# Patient Record
Sex: Female | Born: 1965 | Race: White | Hispanic: No | Marital: Married | Smoking: Former smoker
Health system: Southern US, Community
[De-identification: ages and names within clinical notes are randomized; demographics above are authoritative.]

## PROBLEM LIST (undated history)

## (undated) DIAGNOSIS — F429 Obsessive-compulsive disorder, unspecified: Secondary | ICD-10-CM

## (undated) DIAGNOSIS — E063 Autoimmune thyroiditis: Secondary | ICD-10-CM

## (undated) DIAGNOSIS — D582 Other hemoglobinopathies: Secondary | ICD-10-CM

## (undated) DIAGNOSIS — E785 Hyperlipidemia, unspecified: Secondary | ICD-10-CM

## (undated) HISTORY — PX: MINOR CARPAL TUNNEL: SHX6472

## (undated) HISTORY — DX: Autoimmune thyroiditis: E06.3

## (undated) HISTORY — DX: Other hemoglobinopathies: D58.2

## (undated) HISTORY — DX: Hyperlipidemia, unspecified: E78.5

---

## 1999-08-18 ENCOUNTER — Other Ambulatory Visit: Admission: RE | Admit: 1999-08-18 | Discharge: 1999-08-18 | Payer: Self-pay | Admitting: *Deleted

## 1999-09-28 ENCOUNTER — Other Ambulatory Visit: Admission: RE | Admit: 1999-09-28 | Discharge: 1999-09-28 | Payer: Self-pay | Admitting: *Deleted

## 1999-09-28 ENCOUNTER — Encounter (INDEPENDENT_AMBULATORY_CARE_PROVIDER_SITE_OTHER): Payer: Self-pay | Admitting: Specialist

## 2000-01-18 ENCOUNTER — Other Ambulatory Visit: Admission: RE | Admit: 2000-01-18 | Discharge: 2000-01-18 | Payer: Self-pay | Admitting: *Deleted

## 2000-05-29 ENCOUNTER — Other Ambulatory Visit: Admission: RE | Admit: 2000-05-29 | Discharge: 2000-05-29 | Payer: Self-pay | Admitting: Gynecology

## 2000-10-06 ENCOUNTER — Ambulatory Visit (HOSPITAL_COMMUNITY): Admission: RE | Admit: 2000-10-06 | Discharge: 2000-10-06 | Payer: Self-pay | Admitting: Obstetrics and Gynecology

## 2000-11-07 ENCOUNTER — Encounter: Payer: Self-pay | Admitting: Obstetrics and Gynecology

## 2000-11-07 ENCOUNTER — Observation Stay (HOSPITAL_COMMUNITY): Admission: AD | Admit: 2000-11-07 | Discharge: 2000-11-08 | Payer: Self-pay | Admitting: Obstetrics & Gynecology

## 2001-01-12 ENCOUNTER — Inpatient Hospital Stay (HOSPITAL_COMMUNITY): Admission: RE | Admit: 2001-01-12 | Discharge: 2001-01-16 | Payer: Self-pay | Admitting: Obstetrics and Gynecology

## 2001-01-12 ENCOUNTER — Encounter: Payer: Self-pay | Admitting: Obstetrics and Gynecology

## 2001-01-16 ENCOUNTER — Inpatient Hospital Stay (HOSPITAL_COMMUNITY): Admission: AD | Admit: 2001-01-16 | Discharge: 2001-01-16 | Payer: Self-pay | Admitting: Obstetrics & Gynecology

## 2001-06-11 ENCOUNTER — Other Ambulatory Visit: Admission: RE | Admit: 2001-06-11 | Discharge: 2001-06-11 | Payer: Self-pay | Admitting: *Deleted

## 2002-09-05 ENCOUNTER — Other Ambulatory Visit: Admission: RE | Admit: 2002-09-05 | Discharge: 2002-09-05 | Payer: Self-pay | Admitting: *Deleted

## 2002-10-11 ENCOUNTER — Encounter: Admission: RE | Admit: 2002-10-11 | Discharge: 2002-10-11 | Payer: Self-pay | Admitting: Endocrinology

## 2002-10-11 ENCOUNTER — Encounter: Payer: Self-pay | Admitting: Endocrinology

## 2003-05-16 ENCOUNTER — Encounter: Admission: RE | Admit: 2003-05-16 | Discharge: 2003-05-16 | Payer: Self-pay | Admitting: Family Medicine

## 2003-09-10 ENCOUNTER — Other Ambulatory Visit: Admission: RE | Admit: 2003-09-10 | Discharge: 2003-09-10 | Payer: Self-pay | Admitting: *Deleted

## 2004-09-28 ENCOUNTER — Other Ambulatory Visit: Admission: RE | Admit: 2004-09-28 | Discharge: 2004-09-28 | Payer: Self-pay | Admitting: *Deleted

## 2006-01-03 ENCOUNTER — Other Ambulatory Visit: Admission: RE | Admit: 2006-01-03 | Discharge: 2006-01-03 | Payer: Self-pay | Admitting: *Deleted

## 2007-01-18 ENCOUNTER — Other Ambulatory Visit: Admission: RE | Admit: 2007-01-18 | Discharge: 2007-01-18 | Payer: Self-pay | Admitting: *Deleted

## 2010-09-23 ENCOUNTER — Other Ambulatory Visit: Payer: Self-pay | Admitting: Orthopedic Surgery

## 2010-09-23 DIAGNOSIS — M25561 Pain in right knee: Secondary | ICD-10-CM

## 2010-09-23 DIAGNOSIS — R609 Edema, unspecified: Secondary | ICD-10-CM

## 2010-09-25 ENCOUNTER — Ambulatory Visit
Admission: RE | Admit: 2010-09-25 | Discharge: 2010-09-25 | Disposition: A | Discharge status: Home / Self Care | Payer: Self-pay | Source: Ambulatory Visit | Attending: Orthopedic Surgery | Admitting: Orthopedic Surgery

## 2010-09-25 DIAGNOSIS — R609 Edema, unspecified: Secondary | ICD-10-CM

## 2010-09-25 DIAGNOSIS — M25561 Pain in right knee: Secondary | ICD-10-CM

## 2010-11-12 NOTE — Discharge Summary (Signed)
[facility]  Patient:    Teresa Sanders, Teresa Sanders Visit Number: [id number] MRN: [id number]          Service Type: MED Location: [facility] Attending Physician:  Lars Pinks Dictated by:   Leilani Able, P.A. Admit Date:  01/16/2001 Discharge Date: 01/16/2001                             Discharge Summary  FINAL DIAGNOSES:              1. Intrauterine pregnancy at [redacted] weeks                                  gestation.                               2. Oligohydramnios.                               3. Arrest of dilation at 8 cm.                               4. Chorioamnionitis.  PROCEDURE:                    Primary low transverse cesarean section.  SURGEON:                      Marcelle Overlie, M.D.  COMPLICATIONS:                None.  HOSPITAL COURSE:              This 46 year old G1, P0 presents at [redacted] weeks gestation for induction secondary to oligohydramnios.  The patient was started on Pitocin protocol.  She progressed very slowly throughout the day and started to develop a temperature later on in the day.  Because her cervix was only 8 cm dilated and she was developing chorioamnionitis, she was to proceed with a cesarean section.  She was taken to the operating room by Dr. Vincente Poli on January 13, 2001, where a primary low transverse cesarean section was performed with the delivery of an 8 pound 5 ounce female infant with Apgars of 8 and 9. The baby was in a transverse position. The delivery went without complications.  The patients postoperative course was complicated by some low grade temperatures which resolved during her hospital stay.  She was afebrile upon discharge.  She was felt ready for discharge on postoperative day #3.  DISCHARGE DIET:               She was sent home on a regular diet.  ACTIVITY:                     She was told to decrease activity.  DISCHARGE MEDICATIONS:        She was told to continue prenatal vitamin.  She was told  she could use over-the-counter pain medications.  FOLLOW-UP:                    She was to follow up in the office in four weeks. Dictated by:   Leilani Able, P.A. Attending Physician:  Lars Pinks  DD:  03/05/01 TD:  03/05/01 Job: [id number]

## 2010-11-12 NOTE — Op Note (Signed)
[facility]  Patient:    SAGAN, WURZEL                             MRN: [id number] Proc. Date: 01/13/01 Adm. Date:  [id number] Attending:  Conley Simmonds A                           Operative Report  PREOPERATIVE DIAGNOSES:         1. Intrauterine pregnancy at 41 weeks.                                 2. Oligohydramnios.                                 3. Arrest of dilatation at 8 cm.                                 4. Chorioamnionitis.  POSTOPERATIVE DIAGNOSES:        1. Intrauterine pregnancy at 41 weeks.                                 2. Oligohydramnios.                                 3. Arrest of dilatation at 8 cm.                                 4. Chorioamnionitis.  OPERATION:                      Primary low transverse cesarean section.  SURGEON:                        Marcelle Overlie, M.D.  ANESTHESIA:                     Epidural.  ESTIMATED BLOOD LOSS:           500 cc.  FINDINGS:                       Female infant in transverse position with Apgars of 8 at one minute and 9 at five minutes, weighing 8 pounds, 5 ounces. COMPLICATIONS:                  None.  DESCRIPTION OF PROCEDURE:       The patient was taken to the operating room where epidural was dosed and found to be adequate.  The abdomen was prepped and draped in the usual sterile fashion.  The Foley catheter was already placed in the bladder.  The scalpel was used to perform a low transverse incision in the skin and it was carried down to the fascia using electrocautery.  The fascia was scored in the midline and extended laterally. A Pfannenstiel incision was then created and the rectus muscles were separated.  The peritoneum was then entered and the peritoneal incision was then stretched.  The bladder  blade was inserted.  The lower uterine segment was identified and the bladder flap was created sharply and then digitally and then the bladder blade was then readjusted.  A low  transverse incision was made in the uterus and was then extended laterally.  The amniotic fluid was noted to be clear upon entry into the uterine cavity.  The baby was delivered in cephalic presentation.  It was a female infant with Apgars of 8 at one minute and 9 at five minutes, weighing 8 pounds 5 ounces.  The cord was clamped and cut.  Cord blood was obtained, and the placenta was manually removed and noted to be intact.  The uterus was cleared of all clots and debris.  The uterine incision was closed in a single layer using 0 chromic in a continuous running locked stitch.  The wound was inspected and was found to be hemostatic.  The peritoneum was closed using 0 Vicryl in a continuous running stitch, and the rectus muscles were reapproximated using 7-0 Vicryl.  The fascia was closed using 0 Vicryl in a continuous running stitch starting at each corner and meeting in the midline.  After inspection of the subcutaneous layer, the skin was closed with staples.  All sponge, lap and instrument counts were correct x 2.  The patient tolerated the procedure well and went to the recovery room in stable condition. DD:  01/13/01 TD:  01/15/01 Job: [id number]

## 2011-09-22 ENCOUNTER — Emergency Department (HOSPITAL_BASED_OUTPATIENT_CLINIC_OR_DEPARTMENT_OTHER)
Admission: EM | Admit: 2011-09-22 | Discharge: 2011-09-22 | Disposition: A | Discharge status: Home / Self Care | Payer: 59 | Attending: Emergency Medicine | Admitting: Emergency Medicine

## 2011-09-22 ENCOUNTER — Emergency Department (INDEPENDENT_AMBULATORY_CARE_PROVIDER_SITE_OTHER): Payer: 59

## 2011-09-22 ENCOUNTER — Encounter (HOSPITAL_BASED_OUTPATIENT_CLINIC_OR_DEPARTMENT_OTHER): Payer: Self-pay | Admitting: *Deleted

## 2011-09-22 DIAGNOSIS — S93409A Sprain of unspecified ligament of unspecified ankle, initial encounter: Secondary | ICD-10-CM | POA: Insufficient documentation

## 2011-09-22 DIAGNOSIS — M25579 Pain in unspecified ankle and joints of unspecified foot: Secondary | ICD-10-CM

## 2011-09-22 DIAGNOSIS — R609 Edema, unspecified: Secondary | ICD-10-CM | POA: Insufficient documentation

## 2011-09-22 DIAGNOSIS — E079 Disorder of thyroid, unspecified: Secondary | ICD-10-CM | POA: Insufficient documentation

## 2011-09-22 DIAGNOSIS — X500XXA Overexertion from strenuous movement or load, initial encounter: Secondary | ICD-10-CM | POA: Insufficient documentation

## 2011-09-22 HISTORY — DX: Obsessive-compulsive disorder, unspecified: F42.9

## 2011-09-22 MED ORDER — OXYCODONE-ACETAMINOPHEN 5-325 MG PO TABS: 2.0000 | ORAL_TABLET | ORAL | Status: AC | PRN

## 2011-09-22 NOTE — ED Notes (Signed)
Twisted her right ankle 3 days ago. Swelling and pain. Has been walking on it since the injury.

## 2011-09-22 NOTE — Discharge Instructions (Signed)
Ankle Sprain An ankle sprain is an injury to the strong, fibrous tissues (ligaments) that hold the bones of your ankle joint together.  CAUSES Ankle sprain usually is caused by a fall or by twisting your ankle. People who participate in sports are more prone to these types of injuries.  SYMPTOMS  Symptoms of ankle sprain include:  Pain in your ankle. The pain may be present at rest or only when you are trying to stand or walk.   Swelling.   Bruising. Bruising may develop immediately or within 1 to 2 days after your injury.   Difficulty standing or walking.  DIAGNOSIS  Your caregiver will ask you details about your injury and perform a physical exam of your ankle to determine if you have an ankle sprain. During the physical exam, your caregiver will press and squeeze specific areas of your foot and ankle. Your caregiver will try to move your ankle in certain ways. An X-ray exam may be done to be sure a bone was not broken or a ligament did not separate from one of the bones in your ankle (avulsion).  TREATMENT  Certain types of braces can help stabilize your ankle. Your caregiver can make a recommendation for this. Your caregiver may recommend the use of medication for pain. If your sprain is severe, your caregiver may refer you to a surgeon who helps to restore function to parts of your skeletal system (orthopedist) or a physical therapist. HOME CARE INSTRUCTIONS  Apply ice to your injury for 1 to 2 days or as directed by your caregiver. Applying ice helps to reduce inflammation and pain.  Put ice in a plastic bag.   Place a towel between your skin and the bag.   Leave the ice on for 15 to 20 minutes at a time, every 2 hours while you are awake.   Take over-the-counter or prescription medicines for pain, discomfort, or fever only as directed by your caregiver.   Keep your injured leg elevated, when possible, to lessen swelling.   If your caregiver recommends crutches, use them as  instructed. Gradually, put weight on the affected ankle. Continue to use crutches or a cane until you can walk without feeling pain in your ankle.   If you have a plaster splint, wear the splint as directed by your caregiver. Do not rest it on anything harder than a pillow the first 24 hours. Do not put weight on it. Do not get it wet. You may take it off to take a shower or bath.   You may have been given an elastic bandage to wear around your ankle to provide support. If the elastic bandage is too tight (you have numbness or tingling in your foot or your foot becomes cold and blue), adjust the bandage to make it comfortable.   If you have an air splint, you may blow more air into it or let air out to make it more comfortable. You may take your splint off at night and before taking a shower or bath.   Wiggle your toes in the splint several times per day if you are able.  SEEK MEDICAL CARE IF:   You have an increase in bruising, swelling, or pain.   Your toes feel cold.   Pain relief is not achieved with medication.  SEEK IMMEDIATE MEDICAL CARE IF: Your toes are numb or blue or you have severe pain. MAKE SURE YOU:   Understand these instructions.   Will watch your condition.     Will get help right away if you are not doing well or get worse.  Document Released: 06/13/2005 Document Revised: 06/02/2011 Document Reviewed: 01/16/2008 Exit[employer] Patient Information 2012 [employer]. 

## 2011-09-22 NOTE — ED Provider Notes (Signed)
History     CSN: [id number]  Arrival date & time 09/22/11  1939   First MD Initiated Contact with Patient 09/22/11 2000      Chief Complaint  Patient presents with  . Ankle Pain    (Consider location/radiation/quality/duration/timing/severity/associated sxs/prior treatment) Patient is a 46 y.o. female presenting with ankle pain. The history is provided by the patient.  Ankle Pain  Incident onset: 3 days ago. The incident occurred in the street. Injury mechanism: Twisted her ankle while stepping off a curb. The pain is present in the right ankle. The quality of the pain is described as aching and throbbing. The pain is moderate. The pain has been constant since onset. Pertinent negatives include no numbness. The symptoms are aggravated by bearing weight. She has tried NSAIDs for the symptoms. The treatment provided no relief.    Past Medical History  Diagnosis Date  . Thyroid disease   . OCD (obsessive compulsive disorder)     Past Surgical History  Procedure Date  . Cesarean section     No family history on file.  History  Substance Use Topics  . Smoking status: Current Everyday Smoker -- 0.5 packs/day  . Smokeless tobacco: Not on file  . Alcohol Use: No    OB History    Grav Para Term Preterm Abortions TAB SAB Ect Mult Living                  Review of Systems  Constitutional: Negative for fever.  HENT: Negative for neck pain.   Eyes: Negative.   Gastrointestinal: Negative for nausea and vomiting.  Musculoskeletal: Positive for joint swelling. Negative for back pain.  Skin: Negative for wound.  Neurological: Negative for weakness, numbness and headaches.    Allergies  Penicillins  Home Medications   Current Outpatient Rx  Name Route Sig Dispense Refill  . ALPRAZOLAM 0.25 MG PO TABS Oral Take 0.25 mg by mouth at bedtime as needed.    Marland Kitchen ESCITALOPRAM OXALATE 20 MG PO TABS Oral Take 20 mg by mouth at bedtime.    . IBUPROFEN 200 MG PO TABS Oral Take 200 mg  by mouth every 6 (six) hours as needed. For pain.    Marland Kitchen LEVOTHYROXINE SODIUM 200 MCG PO TABS Oral Take 200 mcg by mouth daily.    . OXYCODONE-ACETAMINOPHEN 5-325 MG PO TABS Oral Take 2 tablets by mouth every 4 (four) hours as needed for pain. 15 tablet 0    BP 120/73  Pulse 80  Temp 98.3 F (36.8 C)  Resp 20  SpO2 99%  LMP 08/22/2011  Physical Exam  Constitutional: She is oriented to person, place, and time. She appears well-developed and well-nourished.  HENT:  Head: Normocephalic and atraumatic.  Musculoskeletal: She exhibits edema and tenderness.       Positive swelling and pain over the lateral malleolus of the right ankle. There is no pain to the foot. There is some tenderness to the proximal fibula. She has normal sensation in her foot. Normal motor function in her foot. Pulses are intact in the foot. No wounds are noted  Neurological: She is alert and oriented to person, place, and time.    ED Course  Procedures (including critical care time)  No results found for this or any previous visit. Dg Tibia/fibula Right  09/22/2011  *RADIOLOGY REPORT*  Clinical Data: Pain, injured  RIGHT TIBIA AND FIBULA - 2 VIEW  Comparison:  None.  Findings: There is no evidence of fracture or other focal  bone lesions.  Soft tissues are unremarkable.  IMPRESSION: Negative.  Original Report Authenticated By: Elsie Stain, M.D.   Dg Ankle Complete Right  09/22/2011  *RADIOLOGY REPORT*  Clinical Data: Twisted 3 days ago, pain  RIGHT ANKLE - COMPLETE 3+ VIEW  Comparison: None.  Findings: Moderate soft tissue swelling.  Small rounded density inferior to the medial malleolus likely represents an old injury.  More sharply angled density inferior to the lateral malleolus, seen best on the oblique view, could represent a small acute avulsion. The talus appears intact as does the ankle mortise.  There is no transverse malleolar fracture.  IMPRESSION: Question small avulsion fracture inferior aspect lateral  malleolus (arrow) with moderate diffuse soft tissue swelling.  Original Report Authenticated By: Elsie Stain, M.D.     1. Ankle sprain       MDM  Patient placed in air splint, given crutches. Instructed in ice and elevation. We'll give prescription for pain medicine and refer to ortho if symptoms not improving        Rolan Bucco, MD 09/22/11 2039

## 2013-05-27 HISTORY — PX: OTHER SURGICAL HISTORY: SHX169

## 2014-10-10 ENCOUNTER — Other Ambulatory Visit: Payer: Self-pay | Admitting: Gastroenterology

## 2014-10-10 DIAGNOSIS — R109 Unspecified abdominal pain: Secondary | ICD-10-CM

## 2014-10-16 ENCOUNTER — Ambulatory Visit
Admission: RE | Admit: 2014-10-16 | Discharge: 2014-10-16 | Disposition: A | Discharge status: Home / Self Care | Payer: 59 | Source: Ambulatory Visit | Attending: Gastroenterology | Admitting: Gastroenterology

## 2014-10-16 DIAGNOSIS — R109 Unspecified abdominal pain: Secondary | ICD-10-CM

## 2014-11-13 ENCOUNTER — Other Ambulatory Visit: Payer: Self-pay | Admitting: General Surgery

## 2014-11-13 ENCOUNTER — Encounter (HOSPITAL_COMMUNITY): Payer: Self-pay | Admitting: *Deleted

## 2014-11-13 NOTE — H&P (Signed)
History of Present Illness Teresa Sanders(Teresa Remund T. Gearlene Godsil MD; 10/30/2014 12:47 PM) Patient words: gallstones.  The patient is a 49 year old female who presents for evaluation of gallbladder disease. She is referred by Dr. Kinnie ScalesMedoff for recurrent abdominal pain and cholelithiasis. She has had some symptoms for a number of months and possibly longer in retrospect. Several months ago after eating at [employer] she developed significant epigastric pressure and pain and nausea. There was some radiation through to the back. Since then she has had ongoing issues. She will occasionally get high back pain at random times. After eating a particularly with fatty foods she continues to have epigastric discomfort and pressure and occasionally nausea. She has a history of IBS and some history of reflux and hiatal hernia and has seen Dr. Kinnie ScalesMedoff for some time. She presented to him for evaluation. Abdominal ultrasound was obtained and she was referred Ultrasound shows multiple gallstones, no significant gallbladder wall thickening. Common bile duct 5.5 mm. Liver shows mild fatty infiltration.   Other Problems Teresa Sanders(Teresa [place]; 10/30/2014 12:44 PM) Cholelithiasis Gastroesophageal Reflux Disease Thyroid Disease  Past Surgical History Teresa Sanders(Teresa Sanders, Sanders; 10/30/2014 12:44 PM) Cesarean Section - 1 Oral Surgery  Diagnostic Studies History Teresa Sanders(Teresa [place]; 10/30/2014 12:44 PM) Colonoscopy 1-5 years ago Mammogram within last year Pap Smear 1-5 years ago  Allergies Teresa Sanders(Teresa Sanders, Sanders; 10/30/2014 12:37 PM) Penicillin G Pot in Dextrose *PENICILLINS*  Medication History Teresa Sanders(Teresa Sanders, Sanders; 10/30/2014 12:36 PM) ALPRAZolam (0.5MG  Tablet, Oral) Active. Escitalopram Oxalate (20MG  Tablet, Oral) Active. Synthroid (200MCG Tablet, Oral) Active. Ibuprofen (200MG  Tablet, Oral) Active. Medications Reconciled  Social History Teresa Sanders(Teresa [place]; 10/30/2014 12:44 PM) Alcohol use Occasional  alcohol use. Caffeine use Carbonated beverages. No drug use Tobacco use Current every day smoker.  Family History Teresa Sanders(Teresa [place]; 10/30/2014 12:44 PM) Alcohol Abuse Father, Sister. Anesthetic complications Mother. Cancer Mother. Colon Cancer Mother.  Pregnancy / Birth History Teresa Sanders(Teresa Sanders[place]; 10/30/2014 12:44 PM) Age at menarche 10 years. Age of menopause 746-50 Contraceptive History Oral contraceptives. Gravida 2 Maternal age 48-30 Para 1 Regular periods  Review of Systems Teresa Sanders(Teresa Sanders; 10/30/2014 12:44 PM) General Not Present- Appetite Loss, Chills, Fatigue, Fever, Night Sweats, Weight Gain and Weight Loss. Skin Not Present- Change in Wart/Mole, Dryness, Hives, Jaundice, New Lesions, Non-Healing Wounds, Rash and Ulcer. HEENT Present- Wears glasses/contact lenses. Not Present- Earache, Hearing Loss, Hoarseness, Nose Bleed, Oral Ulcers, Ringing in the Ears, Seasonal Allergies, Sinus Pain, Sore Throat, Visual Disturbances and Yellow Eyes. Respiratory Not Present- Bloody sputum, Chronic Cough, Difficulty Breathing, Snoring and Wheezing. Breast Not Present- Breast Mass, Breast Pain, Nipple Discharge and Skin Changes. Cardiovascular Not Present- Chest Pain, Difficulty Breathing Lying Down, Leg Cramps, Palpitations, Rapid Heart Rate, Shortness of Breath and Swelling of Extremities. Gastrointestinal Present- Nausea. Not Present- Abdominal Pain, Bloating, Bloody Stool, Change in Bowel Habits, Chronic diarrhea, Constipation, Difficulty Swallowing, Excessive gas, Gets full quickly at meals, Hemorrhoids, Indigestion, Rectal Pain and Vomiting. Female Genitourinary Not Present- Frequency, Nocturia, Painful Urination, Pelvic Pain and Urgency. Musculoskeletal Not Present- Back Pain, Joint Pain, Joint Stiffness, Muscle Pain, Muscle Weakness and Swelling of Extremities. Neurological Not Present- Decreased Memory, Fainting, Headaches, Numbness, Seizures, Tingling,  Tremor, Trouble walking and Weakness. Psychiatric Present- Anxiety. Not Present- Bipolar, Change in Sleep Pattern, Depression, Fearful and Frequent crying. Endocrine Not Present- Cold Intolerance, Excessive Hunger, Hair Changes, Heat Intolerance, Hot flashes and New Diabetes. Hematology Not Present- Easy Bruising, Excessive bleeding, Gland problems, HIV and  Persistent Infections.   Vitals [employer](Teresa Sanders; 10/30/2014 12:35 PM) 10/30/2014 12:34 PM Weight: 193.38 lb Height: 63in Body Surface Area: 1.97 m Body Mass Index: 34.25 kg/m BP: 116/72 (Sitting, Left Arm, Standard)    Physical Exam Teresa Sanders(Teresa Lehrman T. Aqib Lough MD; 10/30/2014 12:51 PM) The physical exam findings are as follows: Note:General: Alert, mildly obese Caucasian female, in no distress Skin: Warm and dry without rash or infection. HEENT: No palpable masses or thyromegaly. Sclera nonicteric. Pupils equal round and reactive. Oropharynx clear. Lymph nodes: No cervical, supraclavicular, or inguinal nodes palpable. Lungs: Breath sounds clear and equal. No wheezing or increased work of breathing. Cardiovascular: Regular rate and rhythm without murmer. No JVD or edema. Peripheral pulses intact. No carotid bruits. Abdomen: Nondistended. Soft and nontender. No masses palpable. No organomegaly. No palpable hernias. Extremities: No edema or joint swelling or deformity. No chronic venous stasis changes. Neurologic: Alert and fully oriented. Gait normal. No focal weakness. Psychiatric: Normal mood and affect. Thought content appropriate with normal judgement and insight    Assessment & Plan Teresa Sanders(Teresa Wollam T. Froylan Hobby MD; 10/30/2014 1:04 PM) CALCULUS OF GALLBLADDER WITH CHRONIC CHOLECYSTITIS WITHOUT OBSTRUCTION (574.10  K80.10) Impression: Her symptoms are entirely compatible with biliary colic and chronic cholecystitis. Gallbladder ultrasound confirms multiple gallstones with no apparent complicating factors. I've recommended proceeding  with laparoscopic cholecystectomy with cholangiogram in an effort to relieve her current symptoms and prevent complications from her gallstones. I discussed the procedure in detail. The patient was given [employer] engineered[employer]ational material. We discussed the risks and benefits of a laparoscopic cholecystectomy and possible cholangiogram including, but not limited to, bleeding, infection, injury to surrounding structures such as the intestine or liver, bile leak, retained gallstones, need to convert to an open procedure, prolonged diarrhea, blood clots such as DVT, common bile duct injury, anesthesia risks, and possible need for additional procedures. The likelihood of improvement in symptoms and return to the patient's normal status is good. We discussed the typical post-operative recovery course. All questions were answered. Current Plans  Schedule for Surgery Laparoscopic cholecystectomy with intraoperative cholangiogram under general anesthesia as an outpatient

## 2014-11-14 ENCOUNTER — Ambulatory Visit (HOSPITAL_COMMUNITY): Payer: 59 | Admitting: Anesthesiology

## 2014-11-14 ENCOUNTER — Ambulatory Visit (HOSPITAL_COMMUNITY): Payer: 59

## 2014-11-14 ENCOUNTER — Ambulatory Visit (HOSPITAL_COMMUNITY)
Admission: RE | Admit: 2014-11-14 | Discharge: 2014-11-14 | Disposition: A | Discharge status: Home / Self Care | Payer: 59 | Source: Ambulatory Visit | Attending: General Surgery | Admitting: General Surgery

## 2014-11-14 ENCOUNTER — Encounter (HOSPITAL_COMMUNITY)
Admission: RE | Disposition: A | Discharge status: Home / Self Care | Payer: Self-pay | Source: Ambulatory Visit | Attending: General Surgery

## 2014-11-14 ENCOUNTER — Encounter (HOSPITAL_COMMUNITY): Payer: Self-pay | Admitting: *Deleted

## 2014-11-14 DIAGNOSIS — K589 Irritable bowel syndrome without diarrhea: Secondary | ICD-10-CM | POA: Diagnosis not present

## 2014-11-14 DIAGNOSIS — F17200 Nicotine dependence, unspecified, uncomplicated: Secondary | ICD-10-CM | POA: Diagnosis not present

## 2014-11-14 DIAGNOSIS — Z791 Long term (current) use of non-steroidal anti-inflammatories (NSAID): Secondary | ICD-10-CM | POA: Diagnosis not present

## 2014-11-14 DIAGNOSIS — Z79899 Other long term (current) drug therapy: Secondary | ICD-10-CM | POA: Diagnosis not present

## 2014-11-14 DIAGNOSIS — K660 Peritoneal adhesions (postprocedural) (postinfection): Secondary | ICD-10-CM | POA: Insufficient documentation

## 2014-11-14 DIAGNOSIS — K8010 Calculus of gallbladder with chronic cholecystitis without obstruction: Secondary | ICD-10-CM | POA: Insufficient documentation

## 2014-11-14 DIAGNOSIS — K219 Gastro-esophageal reflux disease without esophagitis: Secondary | ICD-10-CM | POA: Diagnosis not present

## 2014-11-14 DIAGNOSIS — E079 Disorder of thyroid, unspecified: Secondary | ICD-10-CM | POA: Diagnosis not present

## 2014-11-14 DIAGNOSIS — Z419 Encounter for procedure for purposes other than remedying health state, unspecified: Secondary | ICD-10-CM

## 2014-11-14 DIAGNOSIS — K828 Other specified diseases of gallbladder: Secondary | ICD-10-CM | POA: Diagnosis not present

## 2014-11-14 DIAGNOSIS — K8020 Calculus of gallbladder without cholecystitis without obstruction: Secondary | ICD-10-CM | POA: Diagnosis present

## 2014-11-14 HISTORY — PX: CHOLECYSTECTOMY: SHX55

## 2014-11-14 LAB — CBC
HEMATOCRIT: 41.2 % (ref 36.0–46.0)
HEMOGLOBIN: 13.5 g/dL (ref 12.0–15.0)
MCH: 31.6 pg (ref 26.0–34.0)
MCHC: 32.8 g/dL (ref 30.0–36.0)
MCV: 96.5 fL (ref 78.0–100.0)
Platelets: 208 10*3/uL (ref 150–400)
RBC: 4.27 MIL/uL (ref 3.87–5.11)
RDW: 13.4 % (ref 11.5–15.5)
WBC: 7.3 10*3/uL (ref 4.0–10.5)

## 2014-11-14 LAB — HCG, SERUM, QUALITATIVE: Preg, Serum: NEGATIVE

## 2014-11-14 SURGERY — LAPAROSCOPIC CHOLECYSTECTOMY WITH INTRAOPERATIVE CHOLANGIOGRAM
Anesthesia: General | Site: Abdomen

## 2014-11-14 MED ORDER — GLYCOPYRROLATE 0.2 MG/ML IJ SOLN
INTRAMUSCULAR | Status: AC
  Filled 2014-11-14: qty 1

## 2014-11-14 MED ORDER — PROPOFOL 10 MG/ML IV BOLUS
INTRAVENOUS | Status: DC | PRN
  Administered 2014-11-14: 150 mg via INTRAVENOUS

## 2014-11-14 MED ORDER — ONDANSETRON HCL 4 MG/2ML IJ SOLN
INTRAMUSCULAR | Status: AC
  Filled 2014-11-14: qty 2

## 2014-11-14 MED ORDER — BUPIVACAINE-EPINEPHRINE (PF) 0.25% -1:200000 IJ SOLN
INTRAMUSCULAR | Status: AC
  Filled 2014-11-14: qty 30

## 2014-11-14 MED ORDER — LACTATED RINGERS IV SOLN
INTRAVENOUS | Status: DC | PRN
  Administered 2014-11-14: 1000 mL

## 2014-11-14 MED ORDER — KETOROLAC TROMETHAMINE 30 MG/ML IJ SOLN
INTRAMUSCULAR | Status: DC | PRN
  Administered 2014-11-14: 30 mg via INTRAVENOUS

## 2014-11-14 MED ORDER — PROMETHAZINE HCL 25 MG/ML IJ SOLN: 6.2500 mg | INTRAMUSCULAR | Status: DC | PRN

## 2014-11-14 MED ORDER — MIDAZOLAM HCL 2 MG/2ML IJ SOLN
INTRAMUSCULAR | Status: AC
  Filled 2014-11-14: qty 2

## 2014-11-14 MED ORDER — OXYCODONE HCL 5 MG PO TABS: 5.0000 mg | ORAL_TABLET | ORAL | Status: DC | PRN

## 2014-11-14 MED ORDER — FENTANYL CITRATE (PF) 250 MCG/5ML IJ SOLN
INTRAMUSCULAR | Status: AC
  Filled 2014-11-14: qty 5

## 2014-11-14 MED ORDER — DEXAMETHASONE SODIUM PHOSPHATE 10 MG/ML IJ SOLN
INTRAMUSCULAR | Status: DC | PRN
  Administered 2014-11-14: 10 mg via INTRAVENOUS

## 2014-11-14 MED ORDER — CIPROFLOXACIN IN D5W 400 MG/200ML IV SOLN
400.0000 mg | INTRAVENOUS | Status: AC
  Administered 2014-11-14: 400 mg via INTRAVENOUS

## 2014-11-14 MED ORDER — CHLORHEXIDINE GLUCONATE 4 % EX LIQD: 1.0000 "application " | Freq: Once | CUTANEOUS | Status: DC

## 2014-11-14 MED ORDER — BUPIVACAINE-EPINEPHRINE 0.25% -1:200000 IJ SOLN
INTRAMUSCULAR | Status: DC | PRN
  Administered 2014-11-14: 20 mL

## 2014-11-14 MED ORDER — GLYCOPYRROLATE 0.2 MG/ML IJ SOLN
INTRAMUSCULAR | Status: DC | PRN
  Administered 2014-11-14: 0.6 mg via INTRAVENOUS

## 2014-11-14 MED ORDER — LIDOCAINE HCL (CARDIAC) 20 MG/ML IV SOLN
INTRAVENOUS | Status: AC
  Filled 2014-11-14: qty 5

## 2014-11-14 MED ORDER — SUCCINYLCHOLINE CHLORIDE 20 MG/ML IJ SOLN
INTRAMUSCULAR | Status: DC | PRN
  Administered 2014-11-14: 100 mg via INTRAVENOUS

## 2014-11-14 MED ORDER — FENTANYL CITRATE (PF) 100 MCG/2ML IJ SOLN
INTRAMUSCULAR | Status: DC | PRN
  Administered 2014-11-14: 100 ug via INTRAVENOUS
  Administered 2014-11-14 (×3): 50 ug via INTRAVENOUS

## 2014-11-14 MED ORDER — IOHEXOL 300 MG/ML  SOLN
INTRAMUSCULAR | Status: DC | PRN
  Administered 2014-11-14: 3.5 mL

## 2014-11-14 MED ORDER — PROPOFOL 10 MG/ML IV BOLUS
INTRAVENOUS | Status: AC
  Filled 2014-11-14: qty 20

## 2014-11-14 MED ORDER — NEOSTIGMINE METHYLSULFATE 10 MG/10ML IV SOLN
INTRAVENOUS | Status: DC | PRN
Start: 2014-11-14 — End: 2014-11-14
  Administered 2014-11-14: 4 mg via INTRAVENOUS

## 2014-11-14 MED ORDER — DEXAMETHASONE SODIUM PHOSPHATE 10 MG/ML IJ SOLN
INTRAMUSCULAR | Status: AC
  Filled 2014-11-14: qty 1

## 2014-11-14 MED ORDER — KETOROLAC TROMETHAMINE 30 MG/ML IJ SOLN
INTRAMUSCULAR | Status: AC
  Filled 2014-11-14: qty 1

## 2014-11-14 MED ORDER — LACTATED RINGERS IV SOLN
INTRAVENOUS | Status: DC
  Administered 2014-11-14: 1000 mL via INTRAVENOUS
  Administered 2014-11-14: 12:00:00 via INTRAVENOUS

## 2014-11-14 MED ORDER — HYDROMORPHONE HCL 1 MG/ML IJ SOLN: 0.2500 mg | INTRAMUSCULAR | Status: DC | PRN

## 2014-11-14 MED ORDER — ONDANSETRON HCL 4 MG/2ML IJ SOLN
INTRAMUSCULAR | Status: DC | PRN
  Administered 2014-11-14: 4 mg via INTRAVENOUS

## 2014-11-14 MED ORDER — LIDOCAINE HCL (PF) 2 % IJ SOLN
INTRAMUSCULAR | Status: DC | PRN
  Administered 2014-11-14: 20 mg via INTRADERMAL

## 2014-11-14 MED ORDER — CIPROFLOXACIN IN D5W 400 MG/200ML IV SOLN
INTRAVENOUS | Status: AC
  Filled 2014-11-14: qty 200

## 2014-11-14 MED ORDER — MIDAZOLAM HCL 5 MG/5ML IJ SOLN
INTRAMUSCULAR | Status: DC | PRN
  Administered 2014-11-14: 2 mg via INTRAVENOUS

## 2014-11-14 MED ORDER — OXYCODONE HCL 5 MG PO TABS
5.0000 mg | ORAL_TABLET | Freq: Once | ORAL | Status: AC
  Administered 2014-11-14: 10 mg via ORAL
  Filled 2014-11-14: qty 2

## 2014-11-14 MED ORDER — ROCURONIUM BROMIDE 100 MG/10ML IV SOLN
INTRAVENOUS | Status: DC | PRN
  Administered 2014-11-14: 30 mg via INTRAVENOUS
  Administered 2014-11-14: 10 mg via INTRAVENOUS

## 2014-11-14 MED ORDER — GLYCOPYRROLATE 0.2 MG/ML IJ SOLN
INTRAMUSCULAR | Status: AC
  Filled 2014-11-14: qty 2

## 2014-11-14 SURGICAL SUPPLY — 27 items
APPLIER CLIP ROT 10 11.4 M/L (STAPLE) ×3
CATH REDDICK CHOLANGI 4FR 50CM (CATHETERS) ×3 IMPLANT
CHLORAPREP W/TINT 26ML (MISCELLANEOUS) ×3 IMPLANT
CLIP APPLIE ROT 10 11.4 M/L (STAPLE) ×1 IMPLANT
COVER MAYO STAND STRL (DRAPES) ×3 IMPLANT
DECANTER SPIKE VIAL GLASS SM (MISCELLANEOUS) ×3 IMPLANT
DRAPE C-ARM 42X120 X-RAY (DRAPES) ×3 IMPLANT
DRAPE LAPAROSCOPIC ABDOMINAL (DRAPES) ×3 IMPLANT
ELECT REM PT RETURN 9FT ADLT (ELECTROSURGICAL) ×3
ELECTRODE REM PT RTRN 9FT ADLT (ELECTROSURGICAL) ×1 IMPLANT
GLOVE BIOGEL PI IND STRL 7.5 (GLOVE) ×1 IMPLANT
GLOVE BIOGEL PI INDICATOR 7.5 (GLOVE) ×2
GLOVE ECLIPSE 7.5 STRL STRAW (GLOVE) ×3 IMPLANT
GOWN STRL REUS W/TWL XL LVL3 (GOWN DISPOSABLE) ×9 IMPLANT
HEMOSTAT SNOW SURGICEL 2X4 (HEMOSTASIS) IMPLANT
KIT BASIN OR (CUSTOM PROCEDURE TRAY) ×3 IMPLANT
LIQUID BAND (GAUZE/BANDAGES/DRESSINGS) ×3 IMPLANT
POUCH SPECIMEN RETRIEVAL 10MM (ENDOMECHANICALS) ×3 IMPLANT
SCISSORS LAP 5X35 DISP (ENDOMECHANICALS) ×3 IMPLANT
SET CHOLANGIOGRAPH MIX (MISCELLANEOUS) ×3 IMPLANT
SET IRRIG TUBING LAPAROSCOPIC (IRRIGATION / IRRIGATOR) ×3 IMPLANT
SLEEVE XCEL OPT CAN 5 100 (ENDOMECHANICALS) ×3 IMPLANT
SUT MNCRL AB 4-0 PS2 18 (SUTURE) ×3 IMPLANT
TRAY LAPAROSCOPIC (CUSTOM PROCEDURE TRAY) ×3 IMPLANT
TROCAR BLADELESS OPT 5 100 (ENDOMECHANICALS) ×3 IMPLANT
TROCAR XCEL BLUNT TIP 100MML (ENDOMECHANICALS) ×3 IMPLANT
TROCAR XCEL NON-BLD 11X100MML (ENDOMECHANICALS) ×3 IMPLANT

## 2014-11-14 NOTE — Anesthesia Postprocedure Evaluation (Signed)
  Anesthesia Post-op Note  Patient: Teresa Sanders  Procedure(s) Performed: Procedure(s): LAPAROSCOPIC CHOLECYSTECTOMY WITH INTRAOPERATIVE CHOLANGIOGRAM (N/A)  Patient Location: PACU  Anesthesia Type:General  Level of Consciousness: awake and alert   Airway and Oxygen Therapy: Patient Spontanous Breathing  Post-op Pain: mild  Post-op Assessment: Post-op Vital signs reviewed  Post-op Vital Signs: stable  Last Vitals:  Filed Vitals:   11/14/14 1308  BP: 110/67  Pulse: 76  Temp: 36.8 C  Resp: 16    Complications: No apparent anesthesia complications

## 2014-11-14 NOTE — Interval H&P Note (Signed)
History and Physical Interval Note:  11/14/2014 10:33 AM  Teresa ServerJana Kaster  has presented today for surgery, with the diagnosis of gallstones  The various methods of treatment have been discussed with the patient and family. After consideration of risks, benefits and other options for treatment, the patient has consented to  Procedure(s): LAPAROSCOPIC CHOLECYSTECTOMY WITH INTRAOPERATIVE CHOLANGIOGRAM (N/A) as a surgical intervention .  The patient's history has been reviewed, patient examined, no change in status, stable for surgery.  I have reviewed the patient's chart and labs.  Questions were answered to the patient's satisfaction.     Jasmon Mattice T

## 2014-11-14 NOTE — Transfer of Care (Signed)
Immediate Anesthesia Transfer of Care Note  Patient: Teresa ServerJana Sherrard  Procedure(s) Performed: Procedure(s): LAPAROSCOPIC CHOLECYSTECTOMY WITH INTRAOPERATIVE CHOLANGIOGRAM (N/A)  Patient Location: PACU  Anesthesia Type:General  Level of Consciousness:  sedated, patient cooperative and responds to stimulation  Airway & Oxygen Therapy:Patient Spontanous Breathing and Patient connected to face mask oxgen  Post-op Assessment:  Report given to PACU RN and Post -op Vital signs reviewed and stable  Post vital signs:  Reviewed and stable  Last Vitals:  Filed Vitals:   11/14/14 0840  BP: 106/83  Pulse: 90  Temp: 36.9 C  Resp: 16    Complications: No apparent anesthesia complications

## 2014-11-14 NOTE — Discharge Instructions (Signed)
[facility] ______[facility] °LAPAROSCOPIC SURGERY: POST OP INSTRUCTIONS °Always review your discharge instruction sheet given to you by the facility where your surgery was performed. °IF YOU HAVE DISABILITY OR FAMILY LEAVE FORMS, YOU MUST BRING THEM TO THE OFFICE FOR PROCESSING.   °DO NOT GIVE THEM TO YOUR DOCTOR. ° °1. A prescription for pain medication may be given to you upon discharge.  Take your pain medication as prescribed, if needed.  If narcotic pain medicine is not needed, then you may take acetaminophen (Tylenol) or ibuprofen (Advil) as needed. °2. Take your usually prescribed medications unless otherwise directed. °3. If you need a refill on your pain medication, please contact your pharmacy.  They will contact our office to request authorization. Prescriptions will not be filled after 5pm or on week-ends. °4. You should follow a light diet the first few days after arrival home, such as soup and crackers, etc.  Be sure to include lots of fluids daily. °5. Most patients will experience some swelling and bruising in the area of the incisions.  Ice packs will help.  Swelling and bruising can take several days to resolve.  °6. It is common to experience some constipation if taking pain medication after surgery.  Increasing fluid intake and taking a stool softener (such as Colace) will usually help or prevent this problem from occurring.  A mild laxative (Milk of Magnesia or Miralax) should be taken according to package instructions if there are no bowel movements after 48 hours. °7. Unless discharge instructions indicate otherwise, you may remove your bandages 24-48 hours after surgery, and you may shower at that time.  You may have steri-strips (small skin tapes) in place directly over the incision.  These strips should be left on the skin for 7-10 days.  If your surgeon used skin glue on the incision, you may shower in 24 hours.  The glue will flake off over the next 2-3 weeks.  Any sutures or  staples will be removed at the office during your follow-up visit. °8. ACTIVITIES:  You may resume regular (light) daily activities beginning the next day--such as daily self-care, walking, climbing stairs--gradually increasing activities as tolerated.  You may have sexual intercourse when it is comfortable.  Refrain from any heavy lifting or straining until approved by your doctor. °a. You may drive when you are no longer taking prescription pain medication, you can comfortably wear a seatbelt, and you can safely maneuver your car and apply brakes. °b. RETURN TO WORK:  __________________________________________________________ °9. You should see your doctor in the office for a follow-up appointment approximately 2-3 weeks after your surgery.  Make sure that you call for this appointment within a day or two after you arrive home to insure a convenient appointment time. °10. OTHER INSTRUCTIONS: __________________________________________________________________________________________________________________________ __________________________________________________________________________________________________________________________ °WHEN TO CALL YOUR DOCTOR: °1. Fever over 101.0 °2. Inability to urinate °3. Continued bleeding from incision. °4. Increased pain, redness, or drainage from the incision. °5. Increasing abdominal pain ° °The clinic staff is available to answer your questions during regular business hours.  Please don’t hesitate to call and ask to speak to one of the nurses for clinical concerns.  If you have a medical emergency, go to the nearest emergency room or call 911.  A surgeon from [facility] is always on call at the hospital. °[address], [address], [place], [place]  [address] ? [address] [address], [place], [place]   [address] °[phone] ? [phone] ? FAX [phone] °Web site:   [email or web address] °

## 2014-11-14 NOTE — Anesthesia Procedure Notes (Signed)
Procedure Name: Intubation Date/Time: 11/14/2014 10:53 AM Performed by: Early OsmondEARGLE, Theodis Kinsel E Pre-anesthesia Checklist: Patient identified, Emergency Drugs available, Suction available and Patient being monitored Patient Re-evaluated:Patient Re-evaluated prior to inductionOxygen Delivery Method: Circle System Utilized Preoxygenation: Pre-oxygenation with 100% oxygen Intubation Type: IV induction Ventilation: Mask ventilation without difficulty Laryngoscope Size: Miller and 3 Grade View: Grade II Tube type: Oral Tube size: 7.0 mm Number of attempts: 1 Airway Equipment and Method: Stylet Placement Confirmation: ETT inserted through vocal cords under direct vision,  positive ETCO2 and breath sounds checked- equal and bilateral Secured at: 22 cm Tube secured with: Tape Dental Injury: Teeth and Oropharynx as per pre-operative assessment

## 2014-11-14 NOTE — Op Note (Signed)
Preoperative diagnosis: Cholelithiasis and cholecystitis  Postoperative diagnosis: Cholelithiasis and cholecystitis  Surgical procedure: Laparoscopic cholecystectomy with intraoperative cholangiogram  Surgeon: Sharlet SalinaBenjamin T. Hjalmer Iovino M.D.  Assistant: None  Anesthesia: General Endotracheal  Complications: None  Estimated blood loss: Minimal  Description of procedure: The patient brought to the operating room, placed in the supine position on the operating table, and general endotracheal anesthesia induced. The abdomen was widely sterilely prepped and draped. The patient had received preoperative IV antibiotics and PAS were in place. Patient timeout was performed the correct procedure verified. Standard 4 port technique was used with an open Hassan cannula at the umbilicus and the remainder of the ports placed under direct vision. The gallbladder was visualized. It appeared thickened and chronically inflamed with omental adhesions.  Omental adhesions were taken down off the GB and liver  The fundus was grasped and elevated up over the liver and the infundibulum retracted inferiolaterally. Peritoneum anterior and posterior to close triangle was incised and fibrofatty tissue stripped off the neck of the gallbladder toward the porta hepatis. The distal gallbladder was thoroughly dissected. The cystic artery was identified in close triangle and the cystic duct gallbladder junction dissected 360.  A good critical view was obtained. When the anatomy was clear the cystic duct was clipped at the gallbladder junction and an operative cholangiogram obtained through the cystic duct. This showed good filling of a normal common bile duct and intrahepatic ducts with free flow into the duodenum and no filling defects. Following this the Cholangiocath was removed and the cystic duct was doubly clipped proximally and divided. The cystic artery was doubly clipped proximally and distally and divided. The gallbladder was  dissected free from its bed using hook cautery and removed through the umbilical port site. Complete hemostasis was obtained in the gallbladder bed. The right upper quadrant was thoroughly irrigated and hemostasis assured. Trochars were removed and all CO2 evacuated and the [facility] trocar site fascial defect closed. Skin incisions were closed with subcuticular Monocryl and Dermabond. Sponge needle and instrument counts were correct. The patient was taken to PACU in good condition.  Dorethea Strubel T  11/14/2014

## 2014-11-14 NOTE — Anesthesia Preprocedure Evaluation (Signed)
Anesthesia Evaluation  Patient identified by MRN, date of birth, ID band Patient awake    Reviewed: Allergy & Precautions, NPO status   Airway Mallampati: I       Dental  (+) Teeth Intact   Pulmonary Current Smoker,  breath sounds clear to auscultation        Cardiovascular negative cardio ROS  Rhythm:Regular Rate:Normal     Neuro/Psych negative neurological ROS     GI/Hepatic Neg liver ROS, Gallbladder disease   Endo/Other  negative endocrine ROS  Renal/GU negative Renal ROS     Musculoskeletal   Abdominal   Peds  Hematology negative hematology ROS (+)   Anesthesia Other Findings   Reproductive/Obstetrics                             Anesthesia Physical Anesthesia Plan  ASA: I  Anesthesia Plan: General   Post-op Pain Management:    Induction: Intravenous  Airway Management Planned: Oral ETT  Additional Equipment:   Intra-op Plan:   Post-operative Plan:   Informed Consent: I have reviewed the patients History and Physical, chart, labs and discussed the procedure including the risks, benefits and alternatives for the proposed anesthesia with the patient or authorized representative who has indicated his/her understanding and acceptance.   Dental advisory given  Plan Discussed with: Surgeon and CRNA  Anesthesia Plan Comments:         Anesthesia Quick Evaluation

## 2014-11-17 ENCOUNTER — Encounter (HOSPITAL_COMMUNITY): Payer: Self-pay | Admitting: General Surgery

## 2015-10-02 ENCOUNTER — Other Ambulatory Visit: Payer: Self-pay | Admitting: Orthopedic Surgery

## 2015-10-02 DIAGNOSIS — M25561 Pain in right knee: Secondary | ICD-10-CM

## 2015-10-10 ENCOUNTER — Other Ambulatory Visit: Payer: 59

## 2015-10-12 ENCOUNTER — Ambulatory Visit
Admission: RE | Admit: 2015-10-12 | Discharge: 2015-10-12 | Disposition: A | Discharge status: Home / Self Care | Payer: 59 | Source: Ambulatory Visit | Attending: Orthopedic Surgery | Admitting: Orthopedic Surgery

## 2015-10-12 DIAGNOSIS — M25561 Pain in right knee: Secondary | ICD-10-CM

## 2016-06-30 ENCOUNTER — Ambulatory Visit (INDEPENDENT_AMBULATORY_CARE_PROVIDER_SITE_OTHER): Payer: 59 | Admitting: Gynecology

## 2016-06-30 ENCOUNTER — Encounter: Payer: Self-pay | Admitting: Gynecology

## 2016-06-30 VITALS — BP 130/84 | Ht 64.0 in | Wt 197.8 lb

## 2016-06-30 DIAGNOSIS — Z01411 Encounter for gynecological examination (general) (routine) with abnormal findings: Secondary | ICD-10-CM | POA: Diagnosis not present

## 2016-06-30 DIAGNOSIS — N9089 Other specified noninflammatory disorders of vulva and perineum: Secondary | ICD-10-CM | POA: Insufficient documentation

## 2016-06-30 DIAGNOSIS — Z87410 Personal history of cervical dysplasia: Secondary | ICD-10-CM

## 2016-06-30 NOTE — Addendum Note (Signed)
Addended by: Berna SpareASTILLO, Leela Vanbrocklin A on: 06/30/2016 10:29 AM   Modules accepted: Orders

## 2016-06-30 NOTE — Progress Notes (Signed)
Teresa Sanders 01/19/1966 [id number]   History:    51 y.o.  for annual gyn exam who is a new patient to the practice. Patient previous patient of Dr. Erin Sanders who retired. Patient asymptomatic today. Her psychiatrist is treating her for depression and anxiety for which she is on Lexapro. Dr. Leslie Sanders medical endocrinologist is following her for her hypothyroidism and is doing all her blood work as well. Patient's mother had endometrial cancer many years ago and had surgery and several years after that she developed colon cancer. Patient had a colonoscopy with Dr. Kinnie Sanders 2 years ago it is scheduled for another colonoscopy next year. Patient stated several years ago she had abnormal Pap smears and colposcopy and biopsy but just follow-up. 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 Patient has been menopausal for 4 years and has never wanted to use any hormonal replacement therapy and is otherwise asymptomatic with no vasomotor symptoms.  Past medical history,surgical history, family history and social history were all reviewed and documented in the EPIC chart.  Gynecologic History Patient's last menstrual period was 10/20/2014. Contraception: post menopausal status Last Pap: 3 years ago. Results were: normal Last mammogram: 2 years ago. Results were: Teresa Sanders physician office patient stated she was informed it was normal we have no documentation  Obstetric History OB History  Gravida Para Term Preterm AB Living  2 1     1 1   SAB TAB Ectopic Multiple Live Births  1            # Outcome Date GA Lbr Len/2nd Weight Sex Delivery Anes PTL Lv  2 SAB           1 Para                ROS: A ROS was performed and pertinent positives and negatives are included in the history.  GENERAL: No fevers or chills. HEENT: No change in vision, no earache, sore throat or sinus congestion. NECK: No pain or stiffness.  CARDIOVASCULAR: No chest pain or pressure. No palpitations. PULMONARY: No shortness of breath, cough or wheeze. GASTROINTESTINAL: No abdominal pain, nausea, vomiting or diarrhea, melena or bright red blood per rectum. GENITOURINARY: No urinary frequency, urgency, hesitancy or dysuria. MUSCULOSKELETAL: No joint or muscle pain, no back pain, no recent trauma. DERMATOLOGIC: No rash, no itching, no lesions. ENDOCRINE: No polyuria, polydipsia, no heat or cold intolerance. No recent change in weight. HEMATOLOGICAL: No anemia or easy bruising or bleeding. NEUROLOGIC: No headache, seizures, numbness, tingling or weakness. PSYCHIATRIC: No depression, no loss of interest in normal activity or change in sleep pattern.     Exam: chaperone present  BP 130/84   Ht 5\' 4"  (1.626 m)   Wt 197 lb 12.8 oz (89.7 kg)   LMP 10/20/2014   BMI 33.95 kg/m   Body mass index is 33.95 kg/m.  General appearance : Well developed well nourished female. No acute distress HEENT: Eyes: no retinal hemorrhage or exudates,  Neck supple, trachea midline, no carotid bruits, no thyroidmegaly Lungs: Clear to auscultation, no rhonchi or wheezes, or rib retractions  Heart: Regular rate and rhythm, no murmurs or gallops Breast:Examined in sitting and supine position were symmetrical in appearance, no palpable masses or tenderness,  no skin retraction, no nipple inversion, no nipple discharge, no skin discoloration, no axillary or supraclavicular lymphadenopathy Abdomen: no palpable masses or tenderness, no rebound or  guarding Extremities: no edema or skin discoloration or tenderness  Pelvic: Physical Exam  Genitourinary:        Bartholin, Urethra, Skene Glands: Within normal limits             Vagina: No gross lesions or discharge  Cervix: No gross lesions or discharge  Uterus  anteverted, normal size, shape and consistency, non-tender and mobile  Adnexa  Without masses or tenderness  Anus and perineum  normal   Rectovaginal   normal sphincter tone without palpated masses or tenderness             Hemoccult has colonoscopy this year     Assessment/Plan:  51 y.o. female for annual exam with past history several years ago of abnormal Pap smear with HPV changes according to the patient. 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 Patient will return back to the office in 1-2 weeks for a detail colposcopic examination and biopsy of these black pigmented areas to rule out melanoma or vulvar cancer or venereal warts. A Pap smear with HPV was obtained today. Her PCP has been doing her blood work and her vaccines are up-to-date. She was given a requisition to schedule her overdue mammogram.   Teresa Edwards MD, 10:06 AM 06/30/2016

## 2016-07-01 LAB — PAP, TP IMAGING W/ HPV RNA, RFLX HPV TYPE 16,18/45: HPV mRNA, High Risk: NOT DETECTED

## 2016-07-08 ENCOUNTER — Ambulatory Visit: Payer: 59 | Admitting: Gynecology

## 2016-07-08 ENCOUNTER — Ambulatory Visit (INDEPENDENT_AMBULATORY_CARE_PROVIDER_SITE_OTHER): Payer: 59 | Admitting: Gynecology

## 2016-07-08 ENCOUNTER — Encounter: Payer: Self-pay | Admitting: Gynecology

## 2016-07-08 VITALS — BP 128/80 | Ht 64.0 in | Wt 197.0 lb

## 2016-07-08 DIAGNOSIS — N9089 Other specified noninflammatory disorders of vulva and perineum: Secondary | ICD-10-CM | POA: Diagnosis not present

## 2016-07-08 NOTE — Progress Notes (Deleted)
   Patient is a 51 year old that was asked to come to the office today for colposcopic evaluation due to the fact that at time of her annual exam on January 4 there were some pigmented areas inferior portion of right assessment labia majora.Patient stated several years ago she had abnormal Pap smears and colposcopy and biopsy but just follow-up. 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 Pap smear this month was normal.  Patient with a detail colposcopic evaluation external genitalia perineum and perirectal region with the findings as noted below. Following this a speculum was introduced into the vagina systematic inspection vagina and cervix and fornix did not demonstrate any abnormality.  Pelvic: Physical Exam  Genitourinary:

## 2016-07-08 NOTE — Progress Notes (Signed)
   Patient is a 51 year old that was seen in the office January 4 of this year for annual exam she was noted to have some pigmented areas on the inferior portion of the right labia majora. Her recent Pap smear was normal. Patient many years ago at another facility had some form of HPV viral changes in her Pap smear and she states that subsequent Pap smear were normal but it appears she may have also been treated for some vulvar condyloma as well.  Patient underwent a detail colposcopic evaluation of the external genitalia and vagina and cervix within lesions noted below. The inferior portion of the right labia majora was cleansed with Betadine solution and 1% lidocaine was infiltrated subdermally to keep punch biopsies were obtained from 2 different areas and submitted for histological evaluation. Silver nitrate and Monsel solution was used for hemostasis. Patient tolerated procedure well will notify with the results and manage accordingly. The picture below outlined areas seen.  Pelvic: Physical Exam  Genitourinary:

## 2016-07-08 NOTE — Patient Instructions (Signed)

## 2016-07-08 NOTE — Addendum Note (Signed)
Addended by: Kem ParkinsonBARNES, Jenisa Monty on: 07/08/2016 04:23 PM   Modules accepted: Orders

## 2016-08-05 ENCOUNTER — Ambulatory Visit: Payer: 59 | Admitting: Gynecology

## 2016-08-05 DIAGNOSIS — Z0289 Encounter for other administrative examinations: Secondary | ICD-10-CM

## 2016-09-07 ENCOUNTER — Ambulatory Visit (INDEPENDENT_AMBULATORY_CARE_PROVIDER_SITE_OTHER): Payer: 59

## 2016-09-07 ENCOUNTER — Ambulatory Visit (INDEPENDENT_AMBULATORY_CARE_PROVIDER_SITE_OTHER): Payer: 59 | Admitting: Physician Assistant

## 2016-09-07 ENCOUNTER — Encounter (INDEPENDENT_AMBULATORY_CARE_PROVIDER_SITE_OTHER): Payer: Self-pay | Admitting: Physician Assistant

## 2016-09-07 DIAGNOSIS — M25532 Pain in left wrist: Secondary | ICD-10-CM

## 2016-09-07 NOTE — Progress Notes (Signed)
Office Visit Note   Patient: Teresa Sanders           Date of Birth: 01-28-1966           MRN: [id number] Visit Date: 09/07/2016              Requested by: No referring provider defined for this encounter. PCP: No PCP Per Patient   Assessment & Plan: Visit Diagnoses:  1. Pain in left wrist     Plan: Place her in a Velcro wrist splint she'll wear this most of the time she was taken off for bathing and sleeping. Pro-3 times a day with food. Ice. Follow-up in 3 weeks' check progress lack of.If her pain continues despite conservative treatment recommend MRI to rule out TFCC tear.  Follow-Up Instructions: Return in about 3 weeks (around 09/28/2016).   Orders:  Orders Placed This Encounter  Procedures  . XR Wrist Complete Left   No orders of the defined types were placed in this encounter.     Procedures: No procedures performed   Clinical Data: No additional findings.   Subjective: Chief Complaint  Patient presents with  . Wrist Pain    Patient here today for evaluation of left wrist pain. Patient states that in January, she fell with outstretched arm and has been having left wrist pain since. Pain has started recently getting worse. States she has pain whenever she applies pressure to the wrist. She is starting to drop things. Had no treatment for this whatsoever.       Wrist Pain      Review of Systems   Objective: Vital Signs: LMP 10/20/2014 (Approximate)   Physical Exam  Constitutional: She is oriented to person, place, and time. She appears well-developed and well-nourished. No distress.  Cardiovascular: Intact distal pulses.   Neurological: She is alert and oriented to person, place, and time.  Psychiatric: She has a normal mood and affect. Her behavior is normal.    Ortho Exam Full sensation and motor of both hands. Radial pulses are 2+ and equal and symmetric. There is no rashes skin lesions ulcerations erythema or appreciable swelling of either wrist  or hand. Rght wrist she has full range of motion without pain. Nontender distal radius ulna.  Left wrist she has tenderness over the wrist just distal to the ulnar styloid and has discomfort with radial deviation of the hand. Extension of the left wrist causes pain lateral wrist. Volar flexion causes no pain. Later the left wrist is nontender. Specialty Comments:  No specialty comments available.  Imaging: No results found.   PMFS History: Patient Active Problem List   Diagnosis Date Noted  . Vulvar lesion 06/30/2016  . History of cervical dysplasia 06/30/2016   Past Medical History:  Diagnosis Date  . OCD (obsessive compulsive disorder)     Family History  Problem Relation Age of Onset  . Cancer Mother     UTERINE  AND COLON  . Diabetes Maternal Aunt   . Diabetes Maternal Uncle   . Diabetes Paternal Aunt   . Diabetes Paternal Uncle     Past Surgical History:  Procedure Laterality Date  . CESAREAN SECTION    . CHOLECYSTECTOMY N/A 11/14/2014   Procedure: LAPAROSCOPIC CHOLECYSTECTOMY WITH INTRAOPERATIVE CHOLANGIOGRAM;  Surgeon: Glenna Fellows, MD;  Location: [facility];  Service: General;  Laterality: N/A;  . Colonscopy  05-2013   Social History   Occupational History  . Not on file.   Social History Main Topics  . Smoking  status: Current Every Day Smoker    Packs/day: 0.50  . Smokeless tobacco: Never Used  . Alcohol use No  . Drug use: No  . Sexual activity: Yes    Birth control/ protection: None     Comment: HUSBAND WITH VASECTOMY

## 2016-09-28 ENCOUNTER — Encounter (INDEPENDENT_AMBULATORY_CARE_PROVIDER_SITE_OTHER): Payer: Self-pay

## 2016-09-28 ENCOUNTER — Ambulatory Visit (INDEPENDENT_AMBULATORY_CARE_PROVIDER_SITE_OTHER): Payer: 59 | Admitting: Physician Assistant

## 2016-11-09 ENCOUNTER — Encounter: Payer: Self-pay | Admitting: Gynecology

## 2017-03-13 DIAGNOSIS — Z9114 Patient's other noncompliance with medication regimen: Secondary | ICD-10-CM | POA: Insufficient documentation

## 2017-03-13 DIAGNOSIS — Z91148 Patient's other noncompliance with medication regimen for other reason: Secondary | ICD-10-CM | POA: Insufficient documentation

## 2017-06-08 DIAGNOSIS — Z87891 Personal history of nicotine dependence: Secondary | ICD-10-CM | POA: Insufficient documentation

## 2017-06-28 IMAGING — MR MR KNEE*R* W/O CM
4 of 5 series · 21 of 40 positions shown · non-contrast
Comparison: None.

CLINICAL DATA: Right knee pain for several years.

EXAM:
MRI OF THE RIGHT KNEE WITHOUT CONTRAST
TECHNIQUE: Multiplanar, multisequence MR imaging of the knee was performed. No
intravenous contrast was administered.

[Series 3: pd_tse_fs_tra · axial · 3.5mm · 0.42mm/px · z∈[-13,+62]mm · 3 of 26 slices shown]
[im 4/26]
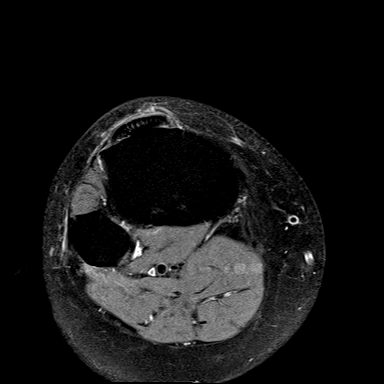
[im 13/26]
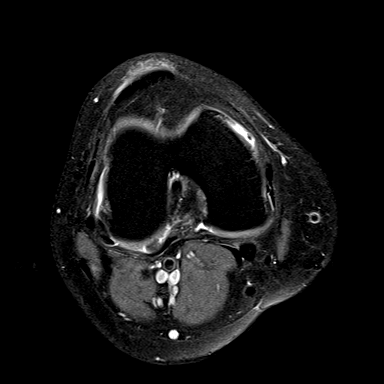
[im 22/26]
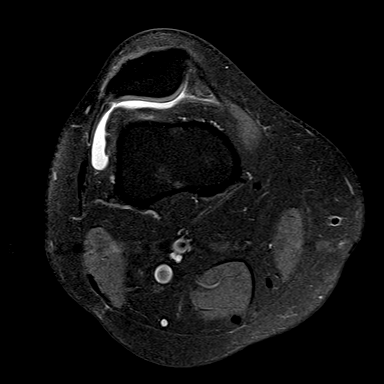

[Series 5: T2 fat-sat · coronal · 3.2mm · 0.62mm/px · 8 of 26 slices shown]
[im 1/26]
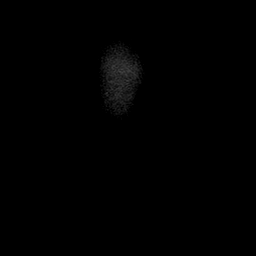
[im 4/26]
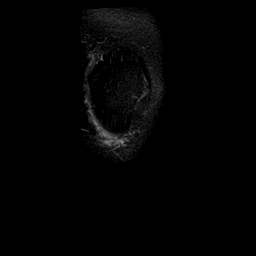
[im 8/26]
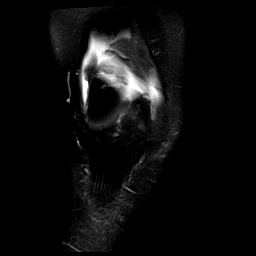
[im 11/26]
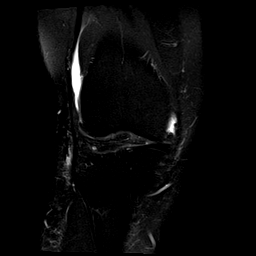
[im 15/26]
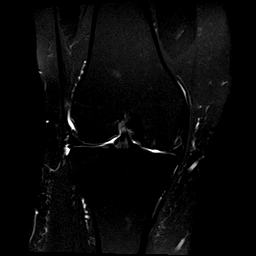
[im 18/26]
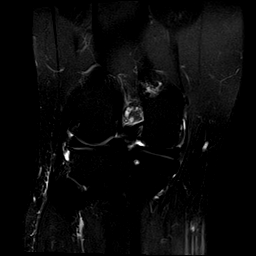
[im 22/26]
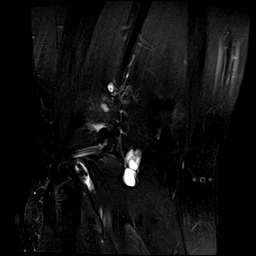
[im 26/26]
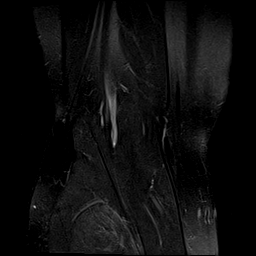

[Series 6: T1 · coronal · 3.2mm · 0.25mm/px · 3 of 26 slices shown]
[im 4/26]
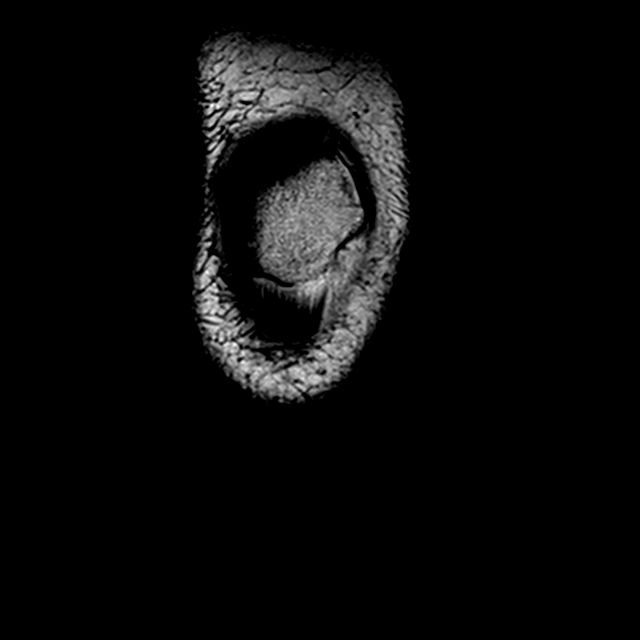
[im 15/26]
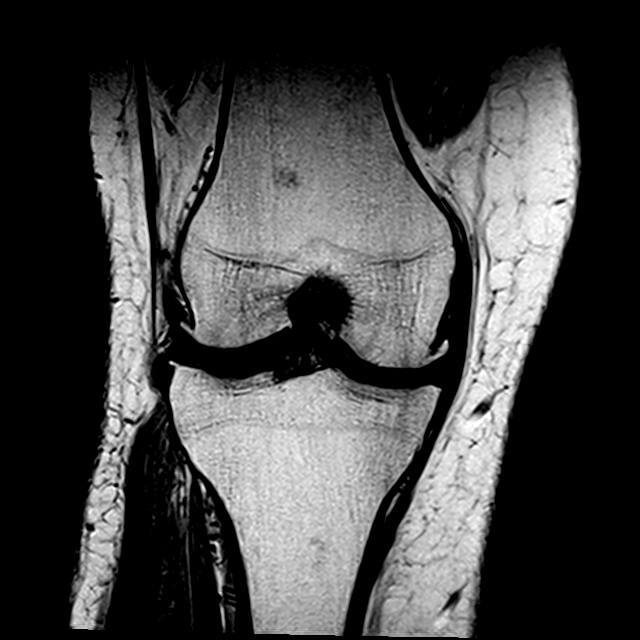
[im 22/26]
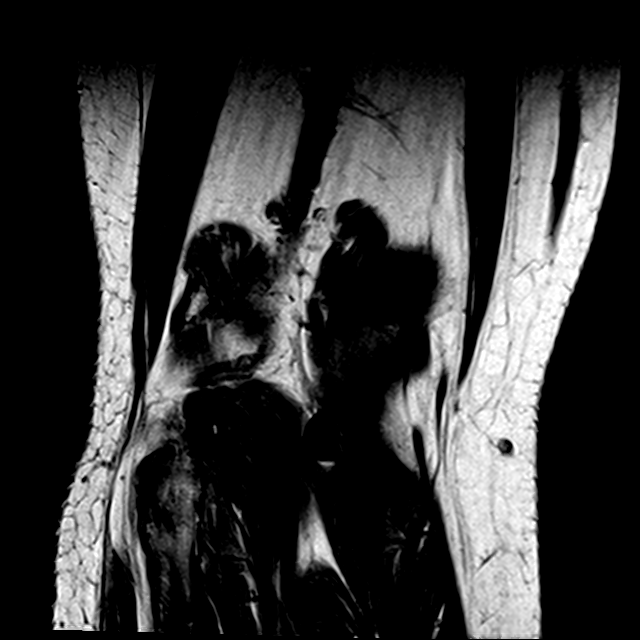

[Series 7: PD fat-sat · sagittal · 3.5mm · 0.25mm/px · 7 of 23 slices shown]
[im 1/23]
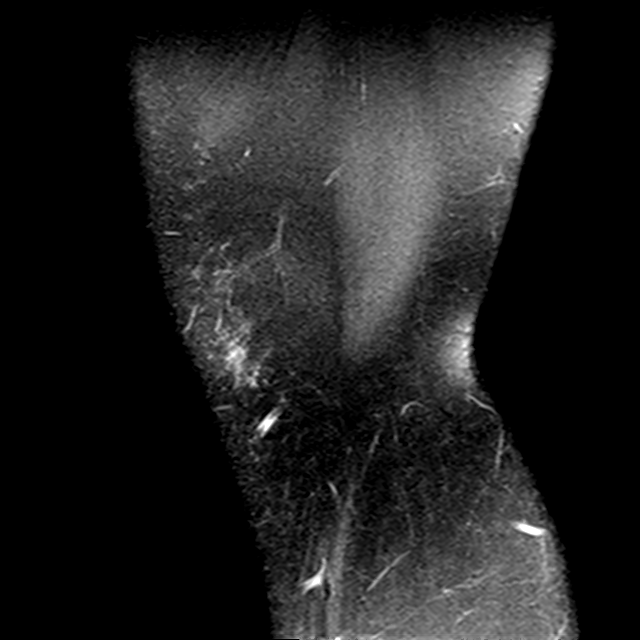
[im 4/23]
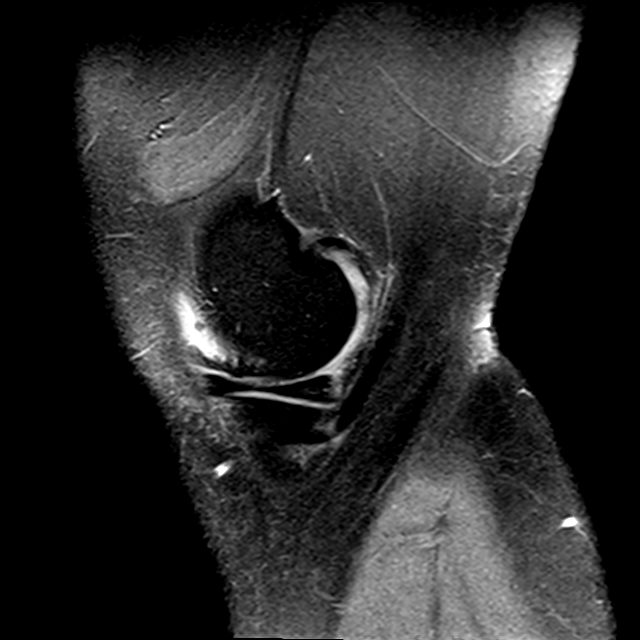
[im 8/23]
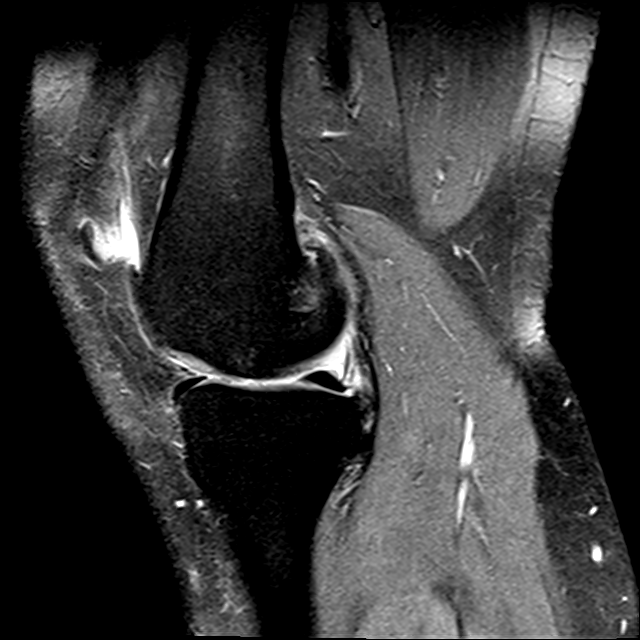
[im 12/23]
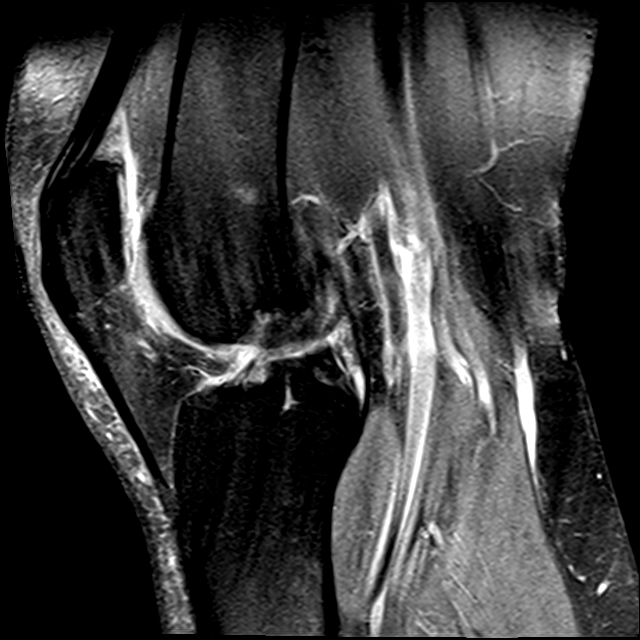
[im 15/23]
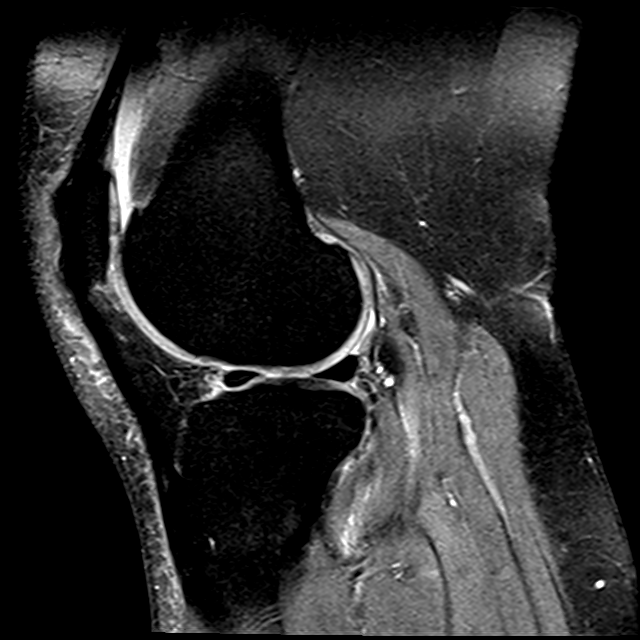
[im 19/23]
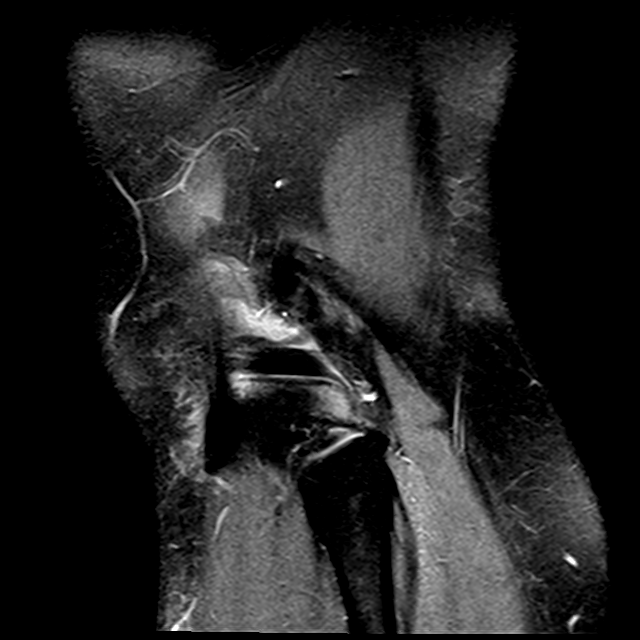
[im 23/23]
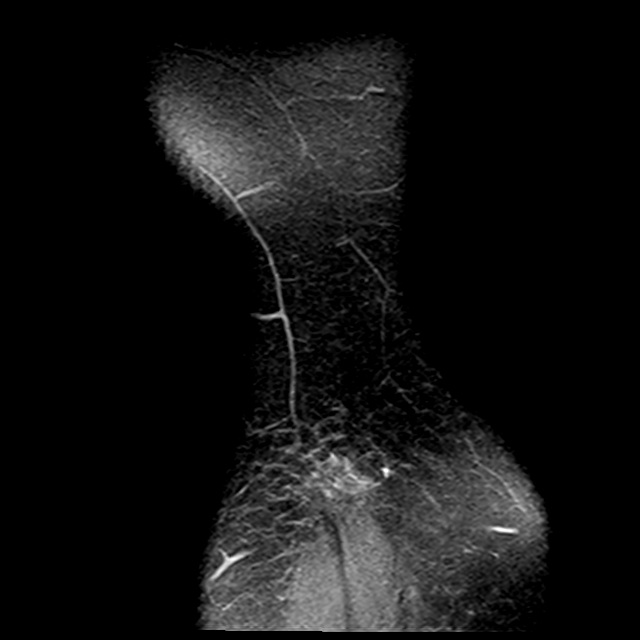

[21 of 40 positions shown; findings below may reference images not displayed]

FINDINGS: MENISCI

Medial meniscus:  Intact.

Lateral meniscus:  Intact.

LIGAMENTS

Cruciates:  Intact ACL and PCL.

Collaterals: Medial collateral ligament is intact. Lateral
collateral ligament complex is intact.

CARTILAGE

Patellofemoral: Partial-thickness cartilage loss with fissuring of
the lateral patellar facet. High-grade partial-thickness cartilage
loss of the lateral trochlea.

Medial: High-grade partial-thickness cartilage loss with areas of
full-thickness cartilage loss of the medial femoral condyle and
medial tibial plateau.

Lateral: Chondromalacia of the lateral femoral condyle and lateral
tibial plateau.

Joint: Small joint effusion. Normal Hoffa's fat. No plical
thickening.

Popliteal Fossa:  No Baker cyst.  Intact popliteus tendon.

Extensor Mechanism:  Intact.

Bones: No focal marrow signal abnormality. Small tricompartmental
marginal osteophytes. No acute osseous abnormality. No aggressive
osseous lesion.
IMPRESSION: 1. Tricompartmental cartilage abnormalities most severe in the
medial femorotibial compartment.

## 2017-08-14 DIAGNOSIS — G5603 Carpal tunnel syndrome, bilateral upper limbs: Secondary | ICD-10-CM | POA: Diagnosis not present

## 2017-08-23 DIAGNOSIS — G5603 Carpal tunnel syndrome, bilateral upper limbs: Secondary | ICD-10-CM | POA: Diagnosis not present

## 2017-08-23 DIAGNOSIS — G5601 Carpal tunnel syndrome, right upper limb: Secondary | ICD-10-CM | POA: Diagnosis not present

## 2017-10-27 DIAGNOSIS — G5601 Carpal tunnel syndrome, right upper limb: Secondary | ICD-10-CM | POA: Diagnosis not present

## 2017-12-01 DIAGNOSIS — E039 Hypothyroidism, unspecified: Secondary | ICD-10-CM | POA: Diagnosis not present

## 2017-12-04 DIAGNOSIS — Z9114 Patient's other noncompliance with medication regimen: Secondary | ICD-10-CM | POA: Diagnosis not present

## 2017-12-04 DIAGNOSIS — Z87891 Personal history of nicotine dependence: Secondary | ICD-10-CM | POA: Diagnosis not present

## 2017-12-04 DIAGNOSIS — E039 Hypothyroidism, unspecified: Secondary | ICD-10-CM | POA: Diagnosis not present

## 2018-01-16 DIAGNOSIS — H04123 Dry eye syndrome of bilateral lacrimal glands: Secondary | ICD-10-CM | POA: Diagnosis not present

## 2018-01-24 DIAGNOSIS — M65311 Trigger thumb, right thumb: Secondary | ICD-10-CM | POA: Insufficient documentation

## 2018-02-16 DIAGNOSIS — H04123 Dry eye syndrome of bilateral lacrimal glands: Secondary | ICD-10-CM | POA: Diagnosis not present

## 2018-03-24 DIAGNOSIS — Z23 Encounter for immunization: Secondary | ICD-10-CM | POA: Diagnosis not present

## 2018-04-27 DIAGNOSIS — M65311 Trigger thumb, right thumb: Secondary | ICD-10-CM | POA: Diagnosis not present

## 2018-04-30 DIAGNOSIS — E039 Hypothyroidism, unspecified: Secondary | ICD-10-CM | POA: Diagnosis not present

## 2018-04-30 DIAGNOSIS — J309 Allergic rhinitis, unspecified: Secondary | ICD-10-CM | POA: Diagnosis not present

## 2018-04-30 DIAGNOSIS — Z23 Encounter for immunization: Secondary | ICD-10-CM | POA: Diagnosis not present

## 2018-05-11 DIAGNOSIS — Z1231 Encounter for screening mammogram for malignant neoplasm of breast: Secondary | ICD-10-CM | POA: Diagnosis not present

## 2018-05-11 DIAGNOSIS — Z01419 Encounter for gynecological examination (general) (routine) without abnormal findings: Secondary | ICD-10-CM | POA: Diagnosis not present

## 2018-05-11 DIAGNOSIS — Z1389 Encounter for screening for other disorder: Secondary | ICD-10-CM | POA: Diagnosis not present

## 2018-05-11 DIAGNOSIS — Z130 Encounter for screening for diseases of the blood and blood-forming organs and certain disorders involving the immune mechanism: Secondary | ICD-10-CM | POA: Diagnosis not present

## 2018-05-29 DIAGNOSIS — Z800 Family history of malignant neoplasm of digestive organs: Secondary | ICD-10-CM | POA: Diagnosis not present

## 2018-05-29 DIAGNOSIS — Z1211 Encounter for screening for malignant neoplasm of colon: Secondary | ICD-10-CM | POA: Diagnosis not present

## 2018-05-29 DIAGNOSIS — Z9049 Acquired absence of other specified parts of digestive tract: Secondary | ICD-10-CM | POA: Diagnosis not present

## 2018-06-01 DIAGNOSIS — Z0000 Encounter for general adult medical examination without abnormal findings: Secondary | ICD-10-CM | POA: Diagnosis not present

## 2018-06-05 DIAGNOSIS — E039 Hypothyroidism, unspecified: Secondary | ICD-10-CM | POA: Diagnosis not present

## 2018-06-06 DIAGNOSIS — Z0000 Encounter for general adult medical examination without abnormal findings: Secondary | ICD-10-CM | POA: Diagnosis not present

## 2018-06-06 DIAGNOSIS — J069 Acute upper respiratory infection, unspecified: Secondary | ICD-10-CM | POA: Diagnosis not present

## 2018-06-06 DIAGNOSIS — Z6838 Body mass index (BMI) 38.0-38.9, adult: Secondary | ICD-10-CM | POA: Diagnosis not present

## 2018-07-17 DIAGNOSIS — R739 Hyperglycemia, unspecified: Secondary | ICD-10-CM | POA: Diagnosis not present

## 2018-08-01 DIAGNOSIS — E039 Hypothyroidism, unspecified: Secondary | ICD-10-CM | POA: Diagnosis not present

## 2018-08-01 DIAGNOSIS — R739 Hyperglycemia, unspecified: Secondary | ICD-10-CM | POA: Diagnosis not present

## 2018-09-19 DIAGNOSIS — E039 Hypothyroidism, unspecified: Secondary | ICD-10-CM | POA: Diagnosis not present

## 2018-09-19 DIAGNOSIS — M1711 Unilateral primary osteoarthritis, right knee: Secondary | ICD-10-CM | POA: Diagnosis not present

## 2018-11-07 DIAGNOSIS — Z23 Encounter for immunization: Secondary | ICD-10-CM | POA: Diagnosis not present

## 2019-09-24 ENCOUNTER — Encounter: Payer: Self-pay | Admitting: Family Medicine

## 2019-09-24 ENCOUNTER — Other Ambulatory Visit: Payer: Self-pay

## 2019-09-24 ENCOUNTER — Ambulatory Visit: Payer: 59 | Admitting: Family Medicine

## 2019-09-24 DIAGNOSIS — M25572 Pain in left ankle and joints of left foot: Secondary | ICD-10-CM

## 2019-09-24 DIAGNOSIS — M79642 Pain in left hand: Secondary | ICD-10-CM

## 2019-09-24 NOTE — Progress Notes (Signed)
Teresa Sanders - 54 y.o. female MRN [id number]  Date of birth: 12-11-1965  Office Visit Note: Visit Date: 09/24/2019 PCP: Eunice Blase, MD Referred by: No ref. provider found  Subjective: Chief Complaint  Patient presents with  . Left Ankle - Pain, Injury    Rolled ankle while stepping down 2 porch steps 09/20/19. Went to urgent care and had xrays - was told she had no fracture, was placed in removable ankle stirrup splint and crutches. The crutches hurt, so is not using them.  . Left Hand - Pain    Pain proximal palmar aspect of hand since the fall - caught self with hands.   HPI: Teresa Sanders is a 54 y.o. female who comes in today with left ankle pain.  She reports that she fell down 2 porch steps 4 days ago, inverted right ankle and landed on ankle. She had immediate pain, bruising, and swelling. She presented to [facility] urgent care in [place]. They obtained x-rays and told her she had no fracture. She was placed in an aircast ankle stirrup splint and crutches. The pain has persisted. She has not tolerated crutches so has not used them. Aircast brace is not helping with pain.  Palmar aspect of left hand has also been hurting since her fall. She landed on her left hand. No swelling, bruising. Feels similar to carpal tunnel pain that she had on her right side. She feels like using the crutches has made the hand pain worse.  ROS Otherwise per HPI.  Assessment & Plan: Visit Diagnoses:  1. Pain in left ankle and joints of left foot   2. Pain in left hand     Plan: Left ankle pain with definite sprain of ATF with laxity with anterior drawer. She may have possible fracture not picked up on x-ray (read as negative). Did not personally view x-rays as they were not available to me, but given TTP over lateral and medial malleolus with pain with bearing weight in aircast stirrup, will place in boot and treat conservatively. Able to bear weight in boot with much more comfort. Return in 2 weeks. If pain  persists, will obtain repeat x-rays at that time.  Follow-up: Return in about 2 weeks (around 10/08/2019).   Procedures: No procedures performed  No notes on file   Clinical History: No specialty comments available.   She reports that she has been smoking. She has been smoking about 0.50 packs per day. She has never used smokeless tobacco. No results for input(s): HGBA1C, LABURIC in the last 8760 hours.  Objective:  VS:  HT:    WT:   BMI:     BP:   HR: bpm  TEMP: ( )  RESP:  Physical Exam  PHYSICAL EXAM: Gen: NAD, alert, cooperative with exam, well-appearing HEENT: clear conjunctiva,  CV:  no edema, capillary refill brisk, normal rate Resp: non-labored Skin: no rashes, normal turgor  Neuro: no gross deficits.  Psych:  alert and oriented  Ortho Exam  Left Ankle: - Inspection:swelling over lateral malleolus and anterior ankle with bruising over anterior ankle - Palpation: TTP over medial and lateral malleoli, no TTP over navicular prominence.  No sign of peroneal tendon subluxation or TTP. - Strength: Normal strength with dorsiflexion, plantarflexion, inversion, and eversion of foot; flexion and extension of toes. Pain with dorisflexion and plantarflexion - ROM: limited ROM secondary to pain - Neuro/vasc: NV intact - Special Tests: some laxity with anterior drawer, normal inversion test.  Negative syndesmotic compression.  Left wrist: No bruising, swelling No TTP No reproduction of symptoms with tinel's over carpal tunnel or with phalen testing.   Imaging: No results found.  Past Medical/Family/Surgical/Social History: Medications & Allergies reviewed per EMR, new medications updated. Patient Active Problem List   Diagnosis Date Noted  . Vulvar lesion 06/30/2016  . History of cervical dysplasia 06/30/2016   Past Medical History:  Diagnosis Date  . OCD (obsessive compulsive disorder)    Family History  Problem Relation Age of Onset  . Cancer Mother         UTERINE  AND COLON  . Diabetes Maternal Aunt   . Diabetes Maternal Uncle   . Diabetes Paternal Aunt   . Diabetes Paternal Uncle    Past Surgical History:  Procedure Laterality Date  . CESAREAN SECTION    . CHOLECYSTECTOMY N/A 11/14/2014   Procedure: LAPAROSCOPIC CHOLECYSTECTOMY WITH INTRAOPERATIVE CHOLANGIOGRAM;  Surgeon: Glenna Fellows, MD;  Location: [facility];  Service: General;  Laterality: N/A;  . Colonscopy  05-2013   Social History   Occupational History  . Not on file  Tobacco Use  . Smoking status: Current Every Day Smoker    Packs/day: 0.50  . Smokeless tobacco: Never Used  Substance and Sexual Activity  . Alcohol use: No  . Drug use: No  . Sexual activity: Yes    Birth control/protection: None    Comment: HUSBAND WITH VASECTOMY

## 2019-09-24 NOTE — Progress Notes (Signed)
I saw and examined the patient with Dr. Robby Sermon and agree with assessment and plan as outlined.    She injured her left ankle about a week ago, inversion injury.  She is very tender over the anterior lateral aspect.  She had x-rays elsewhere which were read as negative.  She is able to bear weight but is quite a bit of pain.  She does not do well with crutches.  Exam shows very slight laxity with anterior drawer and tenderness over the ATFL.  We will treat as ankle sprain, fracture boot, weightbearing as tolerated.  Return in about 2 weeks for recheck.  If still having a lot of tenderness we will recheck x-rays.  If she is doing better, we will start doing Thera-Band exercises.  She also wants to establish primary care.

## 2019-10-09 ENCOUNTER — Ambulatory Visit: Payer: 59 | Admitting: Family Medicine

## 2019-10-09 ENCOUNTER — Other Ambulatory Visit: Payer: Self-pay

## 2019-10-09 ENCOUNTER — Encounter: Payer: Self-pay | Admitting: Family Medicine

## 2019-10-09 ENCOUNTER — Ambulatory Visit: Payer: Self-pay

## 2019-10-09 DIAGNOSIS — M25572 Pain in left ankle and joints of left foot: Secondary | ICD-10-CM | POA: Diagnosis not present

## 2019-10-09 MED ORDER — DICLOFENAC SODIUM 1 % EX GEL: 4.0000 g | Freq: Four times a day (QID) | CUTANEOUS | 6 refills | Status: DC | PRN

## 2019-10-09 NOTE — Progress Notes (Signed)
   Office Visit Note   Patient: Teresa Sanders           Date of Birth: 02/16/1966           MRN: [id number] Visit Date: 10/09/2019 Requested by: Teresa Mesi, MD [address] [place],  [place] [address] PCP: Teresa Mesi, MD  Subjective: Chief Complaint  Patient presents with  . Left Ankle - Follow-up    HPI: She is about 2 weeks status post left ankle sprain here for follow-up.  Overall improved, still having intermittent pain on the medial side of her ankle.  She is wearing her fracture boot with ambulation.              ROS:   All other systems were reviewed and are negative.  Objective: Vital Signs: LMP 10/20/2014 (Approximate)   Physical Exam:  General:  Alert and oriented, in no acute distress. Pulm:  Breathing unlabored. Psy:  Normal mood, congruent affect. Skin: No bruising or swelling. Left ankle: She is tender along the tibialis posterior tendon and at the tip of the medial malleolus.  She has a little bit of pain with supination of her foot against resistance and with flexion of her great toe against resistance.  Good strength, good ankle stability.  Imaging: X-rays left ankle: She has old appearing ossicle near the medial malleolus.  She has an os trigonum as well.  There is some degenerative change near the navicular.  I do not see an acute fracture.  Assessment & Plan: 1.  Clinically healing 2-week status post left ankle sprain with old avulsion fracture near medial malleolus and probable aggravation of tibialis posterior tendon. -We will try Voltaren gel topically.  Home exercises given today.  She will wean from her fracture boot over the next couple weeks.  If pain persist, then physical therapy.     Procedures: No procedures performed  No notes on file     PMFS History: Patient Active Problem List   Diagnosis Date Noted  . Vulvar lesion 06/30/2016  . History of cervical dysplasia 06/30/2016   Past Medical History:  Diagnosis Date  . OCD (obsessive  compulsive disorder)     Family History  Problem Relation Age of Onset  . Cancer Mother        UTERINE  AND COLON  . Diabetes Maternal Aunt   . Diabetes Maternal Uncle   . Diabetes Paternal Aunt   . Diabetes Paternal Uncle     Past Surgical History:  Procedure Laterality Date  . CESAREAN SECTION    . CHOLECYSTECTOMY N/A 11/14/2014   Procedure: LAPAROSCOPIC CHOLECYSTECTOMY WITH INTRAOPERATIVE CHOLANGIOGRAM;  Surgeon: Glenna Fellows, MD;  Location: [facility];  Service: General;  Laterality: N/A;  . Colonscopy  05-2013   Social History   Occupational History  . Not on file  Tobacco Use  . Smoking status: Current Every Day Smoker    Packs/day: 0.50  . Smokeless tobacco: Never Used  Substance and Sexual Activity  . Alcohol use: No  . Drug use: No  . Sexual activity: Yes    Birth control/protection: None    Comment: HUSBAND WITH VASECTOMY

## 2019-10-21 ENCOUNTER — Encounter: Payer: Self-pay | Admitting: Family Medicine

## 2019-10-21 ENCOUNTER — Other Ambulatory Visit: Payer: Self-pay

## 2019-10-21 ENCOUNTER — Ambulatory Visit: Payer: 59 | Admitting: Family Medicine

## 2019-10-21 VITALS — BP 114/81 | HR 91

## 2019-10-21 DIAGNOSIS — Z0000 Encounter for general adult medical examination without abnormal findings: Secondary | ICD-10-CM

## 2019-10-21 DIAGNOSIS — F419 Anxiety disorder, unspecified: Secondary | ICD-10-CM | POA: Diagnosis not present

## 2019-10-21 DIAGNOSIS — R109 Unspecified abdominal pain: Secondary | ICD-10-CM

## 2019-10-21 DIAGNOSIS — E039 Hypothyroidism, unspecified: Secondary | ICD-10-CM | POA: Insufficient documentation

## 2019-10-21 DIAGNOSIS — R739 Hyperglycemia, unspecified: Secondary | ICD-10-CM

## 2019-10-21 DIAGNOSIS — E034 Atrophy of thyroid (acquired): Secondary | ICD-10-CM | POA: Diagnosis not present

## 2019-10-21 NOTE — Progress Notes (Signed)
Office Visit Note   Patient: Teresa Sanders           Date of Birth: [birth year]           MRN: [id number] Visit Date: 10/21/2019 Requested by: Lavada Mesi, MD [address] [place],  [place] [address] PCP: Lavada Mesi, MD  Subjective: Chief Complaint  Patient presents with  . Primary care    HPI: She is here for a wellness exam.  She is a very anxious person, especially since her mother died.  Her mother was thought to be healthy, but went for a wellness exam and was told she had stage IV colon cancer.  Ever since then patient has been a self described hypochondriac.  She took Xanax prior to her appointment this morning.  Patient had a colonoscopy 2 years ago and gets them about every 2 to 3 years.  She has not had any abnormals.  She was diagnosed with hypothyroidism as a child.  She apparently has diminutive thyroid gland and hit it has never functioned properly.  She has been treated with Synthroid and does well on that.  She is due for labs.  Patient states that she has had intermittent right side pain for many years.  It almost always bothers her at night when trying to sleep.  She has had work-up in the past which was unrevealing.  She thinks it is related to anxiety.  Is a strong family history of diabetes.  She has been diagnosed with hyperglycemia and prediabetes with an A1c of 6.0 last time she had a wellness exam.  From an anxiety standpoint she has taken Lexapro but she stopped it on her own, she was not convinced it was helping.                 ROS: She complains of IBS.  No chest pain, SOB, palpitations.  All other systems were reviewed and are negative.  Objective: Vital Signs: BP 114/81   Pulse 91   LMP 10/20/2014 (Approximate)   Physical Exam:  General:  Alert and oriented, in no acute distress. Pulm:  Breathing unlabored. Psy:  Normal mood, congruent affect. Skin:  No suspicious lesions.  HEENT:  [place]/AT, PERRLA, EOM Full, no nystagmus.  Funduscopic  examination within normal limits.  No conjunctival erythema.  Tympanic membranes are pearly gray with normal landmarks.  External ear canals are normal.  Nasal passages are clear.  Oropharynx is clear.  She has poor dentition.  No significant lymphadenopathy.  No thyromegaly or nodules.  2+ carotid pulses without bruits. CV: Regular rate and rhythm without murmurs, rubs, or gallops.  No peripheral edema.  2+ radial and posterior tibial pulses. Lungs: Clear to auscultation throughout with no wheezing or areas of consolidation. Abd: Bowel sounds are active, no hepatosplenomegaly or masses.  Soft and nontender.  No audible bruits.  No evidence of ascites. Ext:  No nail deformities.   Imaging: No results found.  Assessment & Plan: 1.  Wellness exam - Labs today.  2.  Anxiety - Try [facility] Target GBX probiotics. - New Rx if this doesn't help.  3.  Prediabetes - Labs today.  4.  FH colon cancer - Colonoscopy periodically.  5.  IBS - Consider food elimination diet.  6.  Thyroid dysfunction - Labs to evaluate. - Managed by endocrinology.     Procedures: No procedures performed  No notes on file     PMFS History: Patient Active Problem List   Diagnosis Date Noted  .  Hypothyroid 10/21/2019  . Anxiety 10/21/2019  . Vulvar lesion 06/30/2016  . History of cervical dysplasia 06/30/2016   Past Medical History:  Diagnosis Date  . OCD (obsessive compulsive disorder)     Family History  Problem Relation Age of Onset  . Cancer Mother        UTERINE  AND COLON  . Colon cancer Mother   . Diabetes Maternal Aunt   . Diabetes Maternal Uncle   . Diabetes Paternal Aunt   . Hodgkin's lymphoma Paternal Aunt   . Thyroid disease Paternal Aunt   . Diabetes Paternal Uncle   . Liver cancer Father   . Alcohol abuse Father     Past Surgical History:  Procedure Laterality Date  . CESAREAN SECTION    . CHOLECYSTECTOMY N/A 11/14/2014   Procedure: LAPAROSCOPIC CHOLECYSTECTOMY WITH  INTRAOPERATIVE CHOLANGIOGRAM;  Surgeon: Excell Seltzer, MD;  Location: [facility];  Service: General;  Laterality: N/A;  . Colonscopy  05-2013   Social History   Occupational History  . Not on file  Tobacco Use  . Smoking status: Former Smoker    Packs/day: 0.50  . Smokeless tobacco: Never Used  Substance and Sexual Activity  . Alcohol use: No  . Drug use: No  . Sexual activity: Yes    Birth control/protection: None    Comment: HUSBAND WITH VASECTOMY

## 2019-10-22 ENCOUNTER — Telehealth: Payer: Self-pay | Admitting: Family Medicine

## 2019-10-22 LAB — VITAMIN D 25 HYDROXY (VIT D DEFICIENCY, FRACTURES): Vit D, 25-Hydroxy: 9 ng/mL — ABNORMAL LOW (ref 30–100)

## 2019-10-22 LAB — CBC WITH DIFFERENTIAL/PLATELET
Absolute Monocytes: 462 cells/uL (ref 200–950)
Basophils Absolute: 68 cells/uL (ref 0–200)
Basophils Relative: 1 %
Eosinophils Absolute: 292 cells/uL (ref 15–500)
Eosinophils Relative: 4.3 %
HCT: 42 % (ref 35.0–45.0)
Hemoglobin: 13.6 g/dL (ref 11.7–15.5)
Lymphs Abs: 2672 cells/uL (ref 850–3900)
MCH: 30 pg (ref 27.0–33.0)
MCHC: 32.4 g/dL (ref 32.0–36.0)
MCV: 92.5 fL (ref 80.0–100.0)
MPV: 11.5 fL (ref 7.5–12.5)
Monocytes Relative: 6.8 %
Neutro Abs: 3305 cells/uL (ref 1500–7800)
Neutrophils Relative %: 48.6 %
Platelets: 200 10*3/uL (ref 140–400)
RBC: 4.54 10*6/uL (ref 3.80–5.10)
RDW: 12.3 % (ref 11.0–15.0)
Total Lymphocyte: 39.3 %
WBC: 6.8 10*3/uL (ref 3.8–10.8)

## 2019-10-22 LAB — HEMOGLOBIN A1C
Hgb A1c MFr Bld: 5.8 % of total Hgb — ABNORMAL HIGH (ref <5.7)
Mean Plasma Glucose: 120 (calc)
eAG (mmol/L): 6.6 (calc)

## 2019-10-22 LAB — LIPID PANEL
Cholesterol: 199 mg/dL (ref <200)
HDL: 34 mg/dL — ABNORMAL LOW (ref >=50)
LDL Cholesterol (Calc): 135 mg/dL (calc) — ABNORMAL HIGH
Non-HDL Cholesterol (Calc): 165 mg/dL (calc) — ABNORMAL HIGH (ref <130)
Total CHOL/HDL Ratio: 5.9 (calc) — ABNORMAL HIGH (ref <5.0)
Triglycerides: 162 mg/dL — ABNORMAL HIGH (ref <150)

## 2019-10-22 LAB — URINALYSIS, ROUTINE W REFLEX MICROSCOPIC
Bilirubin Urine: NEGATIVE
Glucose, UA: NEGATIVE
Hgb urine dipstick: NEGATIVE
Ketones, ur: NEGATIVE
Leukocytes,Ua: NEGATIVE
Nitrite: NEGATIVE
Protein, ur: NEGATIVE
Specific Gravity, Urine: 1.017 (ref 1.001–1.03)
pH: 6 (ref 5.0–8.0)

## 2019-10-22 LAB — THYROID PANEL WITH TSH
Free Thyroxine Index: 1.2 — ABNORMAL LOW (ref 1.4–3.8)
T3 Uptake: 27 % (ref 22–35)
T4, Total: 4.4 ug/dL — ABNORMAL LOW (ref 5.1–11.9)
TSH: 55.59 mIU/L — ABNORMAL HIGH

## 2019-10-22 LAB — COMPREHENSIVE METABOLIC PANEL
AG Ratio: 1.7 (calc) (ref 1.0–2.5)
ALT: 23 U/L (ref 6–29)
AST: 15 U/L (ref 10–35)
Albumin: 4.3 g/dL (ref 3.6–5.1)
Alkaline phosphatase (APISO): 81 U/L (ref 37–153)
BUN: 10 mg/dL (ref 7–25)
CO2: 24 mmol/L (ref 20–32)
Calcium: 8.7 mg/dL (ref 8.6–10.4)
Chloride: 106 mmol/L (ref 98–110)
Creat: 0.98 mg/dL (ref 0.50–1.05)
Globulin: 2.5 g/dL (calc) (ref 1.9–3.7)
Glucose, Bld: 116 mg/dL — ABNORMAL HIGH (ref 65–99)
Potassium: 4 mmol/L (ref 3.5–5.3)
Sodium: 140 mmol/L (ref 135–146)
Total Bilirubin: 0.3 mg/dL (ref 0.2–1.2)
Total Protein: 6.8 g/dL (ref 6.1–8.1)

## 2019-10-22 LAB — HIGH SENSITIVITY CRP: hs-CRP: 7.9 mg/L — ABNORMAL HIGH

## 2019-10-22 LAB — THYROID PEROXIDASE ANTIBODY: Thyroperoxidase Ab SerPl-aCnc: 8 IU/mL (ref <9)

## 2019-10-22 NOTE — Telephone Encounter (Signed)
Labs are notable for the following:  Vitamin D is extremely low at 9.  We want this to be 50-80.  I recommend taking over-the-counter vitamin D3, 5000 IU tablets, 2 of them daily for the next 3 months and then 1 daily long-term after that.  Recheck levels in about 3 to 6 months.  Blood glucose is elevated in prediabetes range at 116, along with hemoglobin A1c at 5.8.  It is very important to minimize intake of processed carbohydrates including breads, pastas, cereals, sugars and sweets.  We should recheck in 3 to 4 months.  Lipid numbers should improve with dietary changes mentioned above.  C-reactive protein, inflammation marker, should also improve with dietary changes.  Thyroid antibodies were negative, but as expected, TSH is very high at 55.  It is important to take medicine consistently.  Recheck levels in about 3 months.

## 2020-02-06 ENCOUNTER — Encounter: Payer: Self-pay | Admitting: Family Medicine

## 2020-02-06 ENCOUNTER — Ambulatory Visit: Payer: 59 | Admitting: Family Medicine

## 2020-02-06 ENCOUNTER — Other Ambulatory Visit: Payer: Self-pay

## 2020-02-06 VITALS — BP 103/65 | HR 85 | Ht 64.0 in | Wt 209.8 lb

## 2020-02-06 DIAGNOSIS — R739 Hyperglycemia, unspecified: Secondary | ICD-10-CM | POA: Diagnosis not present

## 2020-02-06 DIAGNOSIS — E559 Vitamin D deficiency, unspecified: Secondary | ICD-10-CM

## 2020-02-06 DIAGNOSIS — M25511 Pain in right shoulder: Secondary | ICD-10-CM

## 2020-02-06 DIAGNOSIS — E034 Atrophy of thyroid (acquired): Secondary | ICD-10-CM | POA: Diagnosis not present

## 2020-02-06 DIAGNOSIS — R7982 Elevated C-reactive protein (CRP): Secondary | ICD-10-CM

## 2020-02-06 MED ORDER — LEVOTHYROXINE SODIUM 150 MCG PO TABS
150.0000 ug | ORAL_TABLET | Freq: Every day | ORAL | 6 refills | Status: AC
Start: 2020-02-06 — End: ?

## 2020-02-06 NOTE — Progress Notes (Signed)
Office Visit Note   Patient: Teresa Sanders           Date of Birth: 10-25-1965           MRN: [id number] Visit Date: 02/06/2020 Requested by: Lavada Mesi, MD [address] [place],  [place] [address] PCP: Lavada Mesi, MD  Subjective: Chief Complaint  Patient presents with  . discuss thyroid issues    HPI: She is here for follow-up hypothyroidism.  She would like me to take over management of her thyroid.  Her most recent TSH at endocrinology clinic on July 12 was 0.13.  She is on 175 mcg of Synthroid daily.  She is feeling anxious, having diarrhea and is losing weight.  She does not mind the weight loss, but she is not happy with the anxiety.  She is starting to have thoughts that she might be experiencing cancer.  She wanted reassurance that that was not the case.  This is the first time she has ever taken her medication consistently.  She has Hashimoto's.  From a vitamin D deficiency standpoint she started taking supplementation.  For anxiety, she is on Lexapro with Xanax and BuSpar as needed.  In the past couple months her right shoulder has been hurting, it makes a crunching sound when she moves it overhead.  No injury.  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               ROS:   All other systems were reviewed and are negative.  Objective: Vital Signs: BP 103/65   Pulse 85   Ht 5\' 4"  (1.626 m)   Wt 209 lb 12.8 oz (95.2 kg)   LMP 10/20/2014 (Approximate)   BMI 36.01 kg/m   Physical Exam:  General:  Alert and oriented, in no acute distress. Pulm:  Breathing unlabored. Psy:  Normal mood, congruent affect.  Right shoulder: Full active range of motion, she has palpable crepitus with active and passive range of motion.  She has mild tenderness in the posterior subacromial space.  Pain with empty can and external rotation but her rotator cuff strength is 5/5 throughout.  No tenderness at the [facility] joint or the long head  biceps tendon.  Imaging: No results found.  Assessment & Plan: 1.  Hashimoto's thyroiditis, slightly overmedicated. -I believe this is the cause of her anxiety symptoms and weight loss.  We will decrease her dosage to 150 mcg daily and recheck levels in 6 to 8 weeks. -We discussed the possibility of dietary changes with elimination of gluten and dairy.  2.  Right shoulder impingement -Voltaren gel, home exercises given.     Procedures: No procedures performed  No notes on file     PMFS History: Patient Active Problem List   Diagnosis Date Noted  . Vitamin D deficiency 02/06/2020  . 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 Hypothyroid 10/21/2019  . Anxiety 10/21/2019  . Family history of colon cancer in mother 05/29/2018  . Trigger finger of right thumb 01/24/2018  . History of prior cigarette smoking 06/08/2017  . Nonadherence to medication 03/13/2017  . Vulvar lesion 06/30/2016  . History of cervical dysplasia 06/30/2016   Past Medical History:  Diagnosis Date  . OCD (obsessive compulsive disorder)     Family History  Problem Relation Age of Onset  . Cancer Mother        UTERINE  AND COLON  .  Colon cancer Mother   . Diabetes Maternal Aunt   . Diabetes Maternal Uncle   . Diabetes Paternal Aunt   . Hodgkin's lymphoma Paternal Aunt   . Thyroid disease Paternal Aunt   . Diabetes Paternal Uncle   . Liver cancer Father   . Alcohol abuse Father     Past Surgical History:  Procedure Laterality Date  . CESAREAN SECTION    . CHOLECYSTECTOMY N/A 11/14/2014   Procedure: LAPAROSCOPIC CHOLECYSTECTOMY WITH INTRAOPERATIVE CHOLANGIOGRAM;  Surgeon: Glenna Fellows, MD;  Location: [facility];  Service: General;  Laterality: N/A;  . Colonscopy  05-2013   Social History   Occupational History  . Not on file  Tobacco Use  . Smoking status: Former Smoker    Packs/day: 0.50  . Smokeless tobacco: Never Used  Substance and Sexual Activity  . Alcohol  use: No  . Drug use: No  . Sexual activity: Yes    Birth control/protection: None    Comment: HUSBAND WITH VASECTOMY

## 2020-03-17 ENCOUNTER — Other Ambulatory Visit: Payer: Self-pay

## 2020-03-17 ENCOUNTER — Encounter: Payer: Self-pay | Admitting: Family Medicine

## 2020-03-17 ENCOUNTER — Ambulatory Visit: Payer: 59 | Admitting: Family Medicine

## 2020-03-17 DIAGNOSIS — M25511 Pain in right shoulder: Secondary | ICD-10-CM

## 2020-03-17 NOTE — Progress Notes (Signed)
Office Visit Note   Patient: Teresa Sanders           Date of Birth: 05/31/1966           MRN: [id number] Visit Date: 03/17/2020 Requested by: Lavada Mesi, MD [address] [place],  [place] [address] PCP: Lavada Mesi, MD  Subjective: Chief Complaint  Patient presents with  . Right Shoulder - Pain    Worsening shoulder pain - keeping her up at night now. Would like a cortisone injection.    HPI: She is here with persistent right shoulder pain.  Ongoing pain in the reaching overhead.  She is requesting cortisone injection.  Still not sure whether she wants to get a covid vaccine.                 ROS:   All other systems were reviewed and are negative.  Objective: Vital Signs: LMP 10/20/2014 (Approximate)   Physical Exam:  General:  Alert and oriented, in no acute distress. Pulm:  Breathing unlabored. Psy:  Normal mood, congruent affect.  Right shoulder: Full active range of motion, pain reaching overhead.  Palpable crepitus with active range of motion.  Tender in the posterior subacromial space.  Pain with empty can test but still 5/5 strength.  Imaging: No results found.  Assessment & Plan: 1.  Persistent right shoulder impingement -Subacromial injection today.  MRI scan if she fails to improve.  2.  Vaccine counseling -She is aware that CDC recommendation is for vaccination.  She is still not comfortable with that.  She will continue to contemplate her options.     Procedures: Right shoulder subacromial injection: After sterile prep with Betadine, injected 5 cc 1% lidocaine without epinephrine and 40 mg methylprednisolone from posterior approach.    PMFS History: Patient Active Problem List   Diagnosis Date Noted  . Vitamin D deficiency 02/06/2020  . 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 Hypothyroid 10/21/2019  . Anxiety 10/21/2019  . Family history of colon cancer in mother 05/29/2018  . Trigger finger of right thumb 01/24/2018    . History of prior cigarette smoking 06/08/2017  . Nonadherence to medication 03/13/2017  . Vulvar lesion 06/30/2016  . History of cervical dysplasia 06/30/2016   Past Medical History:  Diagnosis Date  . OCD (obsessive compulsive disorder)     Family History  Problem Relation Age of Onset  . Cancer Mother        UTERINE  AND COLON  . Colon cancer Mother   . Diabetes Maternal Aunt   . Diabetes Maternal Uncle   . Diabetes Paternal Aunt   . Hodgkin's lymphoma Paternal Aunt   . Thyroid disease Paternal Aunt   . Diabetes Paternal Uncle   . Liver cancer Father   . Alcohol abuse Father     Past Surgical History:  Procedure Laterality Date  . CESAREAN SECTION    . CHOLECYSTECTOMY N/A 11/14/2014   Procedure: LAPAROSCOPIC CHOLECYSTECTOMY WITH INTRAOPERATIVE CHOLANGIOGRAM;  Surgeon: Glenna Fellows, MD;  Location: [facility];  Service: General;  Laterality: N/A;  . Colonscopy  05-2013   Social History   Occupational History  . Not on file  Tobacco Use  . Smoking status: Former Smoker    Packs/day: 0.50  . Smokeless tobacco: Never Used  Substance and Sexual Activity  . Alcohol use: No  . Drug use: No  . Sexual activity: Yes    Birth control/protection: None    Comment: HUSBAND WITH VASECTOMY

## 2020-04-01 ENCOUNTER — Other Ambulatory Visit: Payer: Self-pay | Admitting: Family Medicine

## 2020-04-01 DIAGNOSIS — E559 Vitamin D deficiency, unspecified: Secondary | ICD-10-CM

## 2020-04-01 DIAGNOSIS — E034 Atrophy of thyroid (acquired): Secondary | ICD-10-CM

## 2020-04-01 DIAGNOSIS — E785 Hyperlipidemia, unspecified: Secondary | ICD-10-CM

## 2020-04-01 DIAGNOSIS — R739 Hyperglycemia, unspecified: Secondary | ICD-10-CM

## 2020-04-01 DIAGNOSIS — R7982 Elevated C-reactive protein (CRP): Secondary | ICD-10-CM

## 2020-04-02 ENCOUNTER — Ambulatory Visit: Payer: 59 | Admitting: Family Medicine

## 2020-04-06 ENCOUNTER — Ambulatory Visit (INDEPENDENT_AMBULATORY_CARE_PROVIDER_SITE_OTHER): Payer: 59

## 2020-04-06 ENCOUNTER — Other Ambulatory Visit: Payer: Self-pay

## 2020-04-06 DIAGNOSIS — E034 Atrophy of thyroid (acquired): Secondary | ICD-10-CM

## 2020-04-06 DIAGNOSIS — R7982 Elevated C-reactive protein (CRP): Secondary | ICD-10-CM

## 2020-04-06 DIAGNOSIS — E785 Hyperlipidemia, unspecified: Secondary | ICD-10-CM

## 2020-04-06 DIAGNOSIS — E559 Vitamin D deficiency, unspecified: Secondary | ICD-10-CM

## 2020-04-06 DIAGNOSIS — R739 Hyperglycemia, unspecified: Secondary | ICD-10-CM

## 2020-04-06 NOTE — Progress Notes (Signed)
Lab visit only: vit. D, lipid panel, Hgb A1C, BMET, hsCRP, & thyroid panel + TSH. She had eaten approx 4.5 hours prior to blood draw.

## 2020-04-07 ENCOUNTER — Telehealth: Payer: Self-pay | Admitting: Family Medicine

## 2020-04-07 LAB — LIPID PANEL
Cholesterol: 218 mg/dL — ABNORMAL HIGH (ref <200)
HDL: 39 mg/dL — ABNORMAL LOW (ref >=50)
LDL Cholesterol (Calc): 130 mg/dL (calc) — ABNORMAL HIGH
Non-HDL Cholesterol (Calc): 179 mg/dL (calc) — ABNORMAL HIGH (ref <130)
Total CHOL/HDL Ratio: 5.6 (calc) — ABNORMAL HIGH (ref <5.0)
Triglycerides: 340 mg/dL — ABNORMAL HIGH (ref <150)

## 2020-04-07 LAB — HEMOGLOBIN A1C
Hgb A1c MFr Bld: 5.8 % of total Hgb — ABNORMAL HIGH (ref <5.7)
Mean Plasma Glucose: 120 (calc)
eAG (mmol/L): 6.6 (calc)

## 2020-04-07 LAB — BASIC METABOLIC PANEL
BUN: 11 mg/dL (ref 7–25)
CO2: 26 mmol/L (ref 20–32)
Calcium: 9.3 mg/dL (ref 8.6–10.4)
Chloride: 102 mmol/L (ref 98–110)
Creat: 0.94 mg/dL (ref 0.50–1.05)
Glucose, Bld: 91 mg/dL (ref 65–99)
Potassium: 4 mmol/L (ref 3.5–5.3)
Sodium: 137 mmol/L (ref 135–146)

## 2020-04-07 LAB — THYROID PANEL WITH TSH
Free Thyroxine Index: 3.6 (ref 1.4–3.8)
T3 Uptake: 34 % (ref 22–35)
T4, Total: 10.7 ug/dL (ref 5.1–11.9)
TSH: 0.49 mIU/L

## 2020-04-07 LAB — VITAMIN D 25 HYDROXY (VIT D DEFICIENCY, FRACTURES): Vit D, 25-Hydroxy: 31 ng/mL (ref 30–100)

## 2020-04-07 LAB — HIGH SENSITIVITY CRP: hs-CRP: 4.4 mg/L — ABNORMAL HIGH

## 2020-04-07 NOTE — Telephone Encounter (Signed)
Thyroid looks perfect.  CRP is better.  A1C still elevated at 5.8.  How are you doing with diet?  Lipids look worse.  Vitamin D is better, but still not high enough.  What is current dosage?

## 2020-04-08 ENCOUNTER — Ambulatory Visit (INDEPENDENT_AMBULATORY_CARE_PROVIDER_SITE_OTHER): Payer: 59 | Admitting: Family Medicine

## 2020-04-08 ENCOUNTER — Other Ambulatory Visit: Payer: Self-pay

## 2020-04-08 ENCOUNTER — Encounter: Payer: Self-pay | Admitting: Family Medicine

## 2020-04-08 DIAGNOSIS — R739 Hyperglycemia, unspecified: Secondary | ICD-10-CM

## 2020-04-08 DIAGNOSIS — E034 Atrophy of thyroid (acquired): Secondary | ICD-10-CM | POA: Diagnosis not present

## 2020-04-08 DIAGNOSIS — E785 Hyperlipidemia, unspecified: Secondary | ICD-10-CM | POA: Diagnosis not present

## 2020-04-08 NOTE — Progress Notes (Addendum)
Spoke to Teresa Sanders by phone this afternoon to further discuss her lab results. Labs were notable for hemoglobin A1c in the prediabetes range at 5.8. Lipid panel was abnormal with total cholesterol 218, HDL 39, triglyceride 340 and LDL 130. Thyroid panel looks good. Glucose level was 91.  She realizes the importance of making significant lifestyle changes in order to prevent becoming diabetic, and to decrease cardiac risk in the future. She has already begun making dietary changes and plans to start exercising as well. We will recheck labs in 3 to 4 months including hemoglobin A1c, basic metabolic panel, and lipid panel.  I connected with  Teresa Sanders on 04-08-20 by a telephone and verified that I am speaking with the correct person using two identifiers.  I called from [facility].  She was at home.   I discussed the limitations of evaluation and management by telephone. The patient expressed understanding and agreed to proceed.  Conversation lasted 10 minutes.

## 2020-05-13 ENCOUNTER — Encounter: Payer: Self-pay | Admitting: Family Medicine

## 2020-05-13 MED ORDER — HYDROCORTISONE ACETATE 25 MG RE SUPP: 25.0000 mg | Freq: Two times a day (BID) | RECTAL | 6 refills | Status: DC

## 2020-09-28 ENCOUNTER — Encounter: Payer: Self-pay | Admitting: Family Medicine

## 2021-01-11 ENCOUNTER — Encounter: Payer: Self-pay | Admitting: Family Medicine

## 2021-01-11 ENCOUNTER — Telehealth: Payer: Self-pay | Admitting: Family Medicine

## 2021-01-11 NOTE — Telephone Encounter (Signed)
Called and spoke to pt.  No SOB or chest pain, but mild cough.  No HA.  Will probably go to [facility] so someone can listen to lungs.

## 2021-01-14 ENCOUNTER — Encounter: Payer: Self-pay | Admitting: Family Medicine

## 2021-01-20 ENCOUNTER — Encounter: Payer: Self-pay | Admitting: Family Medicine

## 2021-01-20 MED ORDER — AZITHROMYCIN 250 MG PO TABS: ORAL_TABLET | ORAL | 0 refills | Status: DC

## 2022-01-04 ENCOUNTER — Other Ambulatory Visit: Payer: Self-pay | Admitting: Obstetrics and Gynecology

## 2022-11-02 ENCOUNTER — Telehealth: Payer: Self-pay | Admitting: Gastroenterology

## 2022-11-02 NOTE — Telephone Encounter (Signed)
Patient wanting to transfer care from Dr Kinnie Scales office to Dr Rhea Belton. Requesting records for review to have a colon procedure.

## 2023-01-20 NOTE — Telephone Encounter (Signed)
Sent a medical release to [facility] for medical records.

## 2023-02-08 ENCOUNTER — Telehealth: Payer: Self-pay | Admitting: Gastroenterology

## 2023-02-08 ENCOUNTER — Telehealth: Payer: Self-pay | Admitting: Internal Medicine

## 2023-02-08 NOTE — Telephone Encounter (Signed)
Ok for high risk screening colon for fam hx of colon cancer in 1st degree relative Thanks JMP

## 2023-02-08 NOTE — Telephone Encounter (Signed)
Error

## 2023-02-08 NOTE — Telephone Encounter (Signed)
Hi Dr. Rhea Belton,  Patient called to request a transfer of care specifically over to you if possible. She does have GI history with Dr. Kinnie Scales who is no longer practicing. She stated her mother had history of colon cancer, and her last colonoscopy was performed in 2019 and it appears to have been a normal exam. Her op report was scanned into [facility] for you to review and advise on scheduling.   Thank you

## 2023-02-14 ENCOUNTER — Ambulatory Visit (AMBULATORY_SURGERY_CENTER): Payer: 59

## 2023-02-14 VITALS — Ht 64.0 in | Wt 215.0 lb

## 2023-02-14 DIAGNOSIS — Z1211 Encounter for screening for malignant neoplasm of colon: Secondary | ICD-10-CM

## 2023-02-14 MED ORDER — ONDANSETRON HCL 4 MG PO TABS: 4.0000 mg | ORAL_TABLET | Freq: Three times a day (TID) | ORAL | 1 refills | Status: AC | PRN

## 2023-02-14 MED ORDER — NA SULFATE-K SULFATE-MG SULF 17.5-3.13-1.6 GM/177ML PO SOLN: 1.0000 | Freq: Once | ORAL | 0 refills | Status: AC

## 2023-02-14 NOTE — Progress Notes (Signed)
 No egg or soy allergy known to patient  No issues known to pt with past sedation with any surgeries or procedures Patient denies ever being told they had issues or difficulty with intubation  No FH of Malignant Hyperthermia Pt is not on diet pills Pt is not on  home 02  Pt is not on blood thinners  Pt denies issues with chronic constipation  No A fib or A flutter Have any cardiac testing pending--no Patient's chart reviewed by Cathlyn Parsons CNRA prior to previsit and patient appropriate for the [facility].  Previsit completed and red dot placed by patient's name on their procedure day (on provider's schedule).   Ambulates independently Pt instructed to use Singlecare.com or GoodRx for a price reduction on prep

## 2023-02-21 ENCOUNTER — Encounter: Payer: Self-pay | Admitting: Internal Medicine

## 2023-03-08 ENCOUNTER — Ambulatory Visit (AMBULATORY_SURGERY_CENTER): Payer: 59 | Admitting: Internal Medicine

## 2023-03-08 ENCOUNTER — Other Ambulatory Visit: Payer: 59

## 2023-03-08 ENCOUNTER — Encounter: Payer: Self-pay | Admitting: Internal Medicine

## 2023-03-08 VITALS — BP 108/56 | HR 87 | Temp 97.3°F | Resp 10 | Ht 64.0 in | Wt 215.0 lb

## 2023-03-08 DIAGNOSIS — D123 Benign neoplasm of transverse colon: Secondary | ICD-10-CM

## 2023-03-08 DIAGNOSIS — Z800 Family history of malignant neoplasm of digestive organs: Secondary | ICD-10-CM

## 2023-03-08 DIAGNOSIS — K529 Noninfective gastroenteritis and colitis, unspecified: Secondary | ICD-10-CM

## 2023-03-08 DIAGNOSIS — K635 Polyp of colon: Secondary | ICD-10-CM | POA: Diagnosis not present

## 2023-03-08 DIAGNOSIS — Z1211 Encounter for screening for malignant neoplasm of colon: Secondary | ICD-10-CM | POA: Diagnosis present

## 2023-03-08 MED ORDER — SODIUM CHLORIDE 0.9 % IV SOLN: 500.0000 mL | Freq: Once | INTRAVENOUS | Status: DC

## 2023-03-08 NOTE — Progress Notes (Signed)
Uneventful anesthetic. Report to pacu rn. Vss. Care resumed by rn. 

## 2023-03-08 NOTE — Progress Notes (Signed)
Vitals-CW  Pt's states no medical or surgical changes since previsit or office visit. 

## 2023-03-08 NOTE — Progress Notes (Signed)
Called to room to assist during endoscopic procedure.  Patient ID and intended procedure confirmed with present staff. Received instructions for my participation in the procedure from the performing physician.  

## 2023-03-08 NOTE — Patient Instructions (Signed)
Handout provided on polyps.  Resume previous diet.  Continue present medications.  Await pathology results.  Repeat colonoscopy is recommended likely in 5 years. The colonoscopy date will be determined after pathology results from today's exam become available for review.   YOU HAD AN ENDOSCOPIC PROCEDURE TODAY AT THE [facility]:   Refer to the procedure report that was given to you for any specific questions about what was found during the examination.  If the procedure report does not answer your questions, please call your gastroenterologist to clarify.  If you requested that your care partner not be given the details of your procedure findings, then the procedure report has been included in a sealed envelope for you to review at your convenience later.  YOU SHOULD EXPECT: Some feelings of bloating in the abdomen. Passage of more gas than usual.  Walking can help get rid of the air that was put into your GI tract during the procedure and reduce the bloating. If you had a lower endoscopy (such as a colonoscopy or flexible sigmoidoscopy) you may notice spotting of blood in your stool or on the toilet paper. If you underwent a bowel prep for your procedure, you may not have a normal bowel movement for a few days.  Please Note:  You might notice some irritation and congestion in your nose or some drainage.  This is from the oxygen used during your procedure.  There is no need for concern and it should clear up in a day or so.  SYMPTOMS TO REPORT IMMEDIATELY:  Following lower endoscopy (colonoscopy or flexible sigmoidoscopy):  Excessive amounts of blood in the stool  Significant tenderness or worsening of abdominal pains  Swelling of the abdomen that is new, acute  Fever of 100F or higher  For urgent or emergent issues, a gastroenterologist can be reached at any hour by calling [phone]. Do not use MyChart messaging for urgent concerns.    DIET:  We do recommend a small  meal at first, but then you may proceed to your regular diet.  Drink plenty of fluids but you should avoid alcoholic beverages for 24 hours.  ACTIVITY:  You should plan to take it easy for the rest of today and you should NOT DRIVE or use heavy machinery until tomorrow (because of the sedation medicines used during the test).    FOLLOW UP: Our staff will call the number listed on your records the next business day following your procedure.  We will call around 7:15- 8:00 am to check on you and address any questions or concerns that you may have regarding the information given to you following your procedure. If we do not reach you, we will leave a message.     If any biopsies were taken you will be contacted by phone or by letter within the next 1-3 weeks.  Please call us at [phone] if you have not heard about the biopsies in 3 weeks.    SIGNATURES/CONFIDENTIALITY: You and/or your care partner have signed paperwork which will be entered into your electronic medical record.  These signatures attest to the fact that that the information above on your After Visit Summary has been reviewed and is understood.  Full responsibility of the confidentiality of this discharge information lies with you and/or your care-partner.

## 2023-03-08 NOTE — Progress Notes (Signed)
GASTROENTEROLOGY PROCEDURE H&P NOTE   Primary Care Physician: Lavada Mesi, MD    Reason for Procedure:  Family history of colon cancer  Plan:    Colonoscopy  Patient is appropriate for endoscopic procedure(s) in the ambulatory ([facility]) setting.  The nature of the procedure, as well as the risks, benefits, and alternatives were carefully and thoroughly reviewed with the patient. Ample time for discussion and questions allowed. The patient understood, was satisfied, and agreed to proceed.     HPI: Teresa Sanders is a 57 y.o. female who presents for colonoscopy.  Medical history as below.  Tolerated the prep.  No recent chest pain or shortness of breath.  No abdominal pain today.  Past Medical History:  Diagnosis Date   Hashimoto's disease    Hemoglobin D disease (HCC)    Hyperlipidemia    OCD (obsessive compulsive disorder)     Past Surgical History:  Procedure Laterality Date   CESAREAN SECTION     CHOLECYSTECTOMY N/A 11/14/2014   Procedure: LAPAROSCOPIC CHOLECYSTECTOMY WITH INTRAOPERATIVE CHOLANGIOGRAM;  Surgeon: Glenna Fellows, MD;  Location: [facility];  Service: General;  Laterality: N/A;   Colonscopy  05/27/2013   MINOR CARPAL TUNNEL      Prior to Admission medications   Medication Sig Start Date End Date Taking? Authorizing Provider  ALPRAZolam Prudy Feeler) 0.5 MG tablet Take 0.5 mg by mouth 4 (four) times daily as needed. 06/17/19   [provider]  escitalopram (LEXAPRO) 20 MG tablet Take 30 mg by mouth at bedtime.     [provider]  ibuprofen (ADVIL) 200 MG tablet Take 200 mg by mouth every 6 (six) hours as needed. Patient not taking: Reported on 02/06/2020    [provider]  levothyroxine (SYNTHROID) 150 MCG tablet Take 1 tablet (150 mcg total) by mouth daily before breakfast. Patient taking differently: Take 175 mcg by mouth daily before breakfast. 02/06/20   Hilts, Casimiro Needle, MD  ondansetron (ZOFRAN) 4 MG tablet Take 1 tablet (4 mg total) by  mouth every 8 (eight) hours as needed for nausea or vomiting. 02/14/23   Sherlonda Flater, Carie Caddy, MD    Current Outpatient Medications  Medication Sig Dispense Refill   ALPRAZolam (XANAX) 0.5 MG tablet Take 0.5 mg by mouth 4 (four) times daily as needed.     escitalopram (LEXAPRO) 20 MG tablet Take 30 mg by mouth at bedtime.      ibuprofen (ADVIL) 200 MG tablet Take 200 mg by mouth every 6 (six) hours as needed. (Patient not taking: Reported on 02/06/2020)     levothyroxine (SYNTHROID) 150 MCG tablet Take 1 tablet (150 mcg total) by mouth daily before breakfast. (Patient taking differently: Take 175 mcg by mouth daily before breakfast.) 30 tablet 6   ondansetron (ZOFRAN) 4 MG tablet Take 1 tablet (4 mg total) by mouth every 8 (eight) hours as needed for nausea or vomiting. 30 tablet 1   No current facility-administered medications for this visit.    Allergies as of 03/08/2023 - Review Complete 03/08/2023  Allergen Reaction Noted   Other Anaphylaxis 05/21/2018   Penicillins Other (See Comments) 09/22/2011    Family History  Problem Relation Age of Onset   Uterine cancer Mother    Cancer Mother        UTERINE  AND COLON   Colon cancer Mother    Liver cancer Father    Alcohol abuse Father    Diabetes Maternal Aunt    Diabetes Maternal Uncle    Diabetes Paternal Aunt  Hodgkin's lymphoma Paternal Aunt    Thyroid disease Paternal Aunt    Diabetes Paternal Uncle    Esophageal cancer Neg Hx    Rectal cancer Neg Hx    Stomach cancer Neg Hx     Social History   Socioeconomic History   Marital status: Married    Spouse name: Not on file   Number of children: Not on file   Years of education: Not on file   Highest education level: Not on file  Occupational History   Not on file  Tobacco Use   Smoking status: Former    Current packs/day: 0.50    Types: Cigarettes   Smokeless tobacco: Never  Vaping Use   Vaping status: Never Used  Substance and Sexual Activity   Alcohol use: No    Drug use: No   Sexual activity: Yes    Birth control/protection: None    Comment: HUSBAND WITH VASECTOMY   Other Topics Concern   Not on file  Social History Narrative   Not on file   Social Determinants of Health   Financial Resource Strain: Not on file  Food Insecurity: Not on file  Transportation Needs: Not on file  Physical Activity: Not on file  Stress: Not on file  Social Connections: Not on file  Intimate Partner Violence: Not on file    Physical Exam: Vital signs in last 24 hours: @Temp  (!) 97.3 F (36.3 C)   LMP 10/20/2014 (Approximate)  GEN: NAD EYE: Sclerae anicteric ENT: MMM CV: Non-tachycardic Pulm: CTA b/l GI: Soft, NT/ND NEURO:  Alert & Oriented x 3   Erick Blinks, MD [facility]  03/08/2023 10:53 AM

## 2023-03-08 NOTE — Op Note (Signed)
[facility] Patient Name: Teresa Sanders Procedure Date: 03/08/2023 10:47 AM MRN: [id number] Endoscopist: Beverley Fiedler , MD, [id number] Age: 57 Referring MD:  Date of Birth: 1965-11-17 Gender: Female Account #: [id number] Procedure:                Colonoscopy Indications:              Screening patient at increased risk: Family history                            of 1st-degree relative with colorectal cancer at                            age 68 years (or older), Last colonoscopy: December                            2019 Medicines:                Monitored Anesthesia Care Procedure:                Pre-Anesthesia Assessment:                           - Prior to the procedure, a History and Physical                            was performed, and patient medications and                            allergies were reviewed. The patient's tolerance of                            previous anesthesia was also reviewed. The risks                            and benefits of the procedure and the sedation                            options and risks were discussed with the patient.                            All questions were answered, and informed consent                            was obtained. Prior Anticoagulants: The patient has                            taken no anticoagulant or antiplatelet agents. ASA                            Grade Assessment: II - A patient with mild systemic                            disease. After reviewing the risks and benefits,  the patient was deemed in satisfactory condition to                            undergo the procedure.                           After obtaining informed consent, the colonoscope                            was passed under direct vision. Throughout the                            procedure, the patient's blood pressure, pulse, and                            oxygen saturations were monitored continuously. The                             CF [id number] #[id number] was introduced through the anus                            and advanced to the terminal ileum. The colonoscopy                            was performed without difficulty. The patient                            tolerated the procedure well. The quality of the                            bowel preparation was good. The terminal ileum,                            ileocecal valve, appendiceal orifice, and rectum                            were photographed. Scope In: 11:13:13 AM Scope Out: 11:27:39 AM Scope Withdrawal Time: 0 hours 11 minutes 57 seconds  Total Procedure Duration: 0 hours 14 minutes 26 seconds  Findings:                 The digital rectal exam was normal.                           The terminal ileum appeared normal.                           Two sessile polyps were found in the transverse                            colon. The polyps were 4 to 5 mm in size. These                            polyps were removed with a cold snare. Resection  and retrieval were complete.                           The exam was otherwise without abnormality on                            direct and retroflexion views. Complications:            No immediate complications. Estimated Blood Loss:     Estimated blood loss was minimal. Impression:               - The examined portion of the ileum was normal.                           - Two 4 to 5 mm polyps in the transverse colon,                            removed with a cold snare. Resected and retrieved.                           - The examination was otherwise normal on direct                            and retroflexion views. Recommendation:           - Patient has a contact number available for                            emergencies. The signs and symptoms of potential                            delayed complications were discussed with the                            patient. Return to  normal activities tomorrow.                            Written discharge instructions were provided to the                            patient.                           - Resume previous diet.                           - Continue present medications.                           - Await pathology results.                           - Repeat colonoscopy is recommended likely in 5                            years. The colonoscopy date will be determined  after pathology results from today's exam become                            available for review. Beverley Fiedler, MD 03/08/2023 11:31:39 AM This report has been signed electronically.

## 2023-03-09 ENCOUNTER — Telehealth: Payer: Self-pay

## 2023-03-09 LAB — IGA: Immunoglobulin A: 174 mg/dL (ref 47–310)

## 2023-03-09 NOTE — Telephone Encounter (Signed)
  Follow up Call-     03/08/2023   10:55 AM 03/08/2023   10:43 AM  Call back number  Post procedure Call Back phone  # [phone]   Permission to leave phone message  Yes     Patient questions:  Do you have a fever, pain , or abdominal swelling? No. Pain Score  0 *  Have you tolerated food without any problems? Yes.    Have you been able to return to your normal activities? Yes.    Do you have any questions about your discharge instructions: Diet   No. Medications  No. Follow up visit  No.  Do you have questions or concerns about your Care? Yes.    Actions: * If pain score is 4 or above: No action needed, pain <4.

## 2023-03-10 LAB — SURGICAL PATHOLOGY

## 2023-03-13 ENCOUNTER — Encounter: Payer: Self-pay | Admitting: Internal Medicine

## 2023-03-29 ENCOUNTER — Ambulatory Visit: Payer: 59 | Admitting: Registered"
